# Patient Record
Sex: Female | Born: 1955 | Race: White | Hispanic: No | Marital: Married | Smoking: Former smoker
Health system: Southern US, Community
[De-identification: ages and names within clinical notes are randomized; demographics above are authoritative.]

## PROBLEM LIST (undated history)

## (undated) DIAGNOSIS — Z1509 Genetic susceptibility to other malignant neoplasm: Secondary | ICD-10-CM

## (undated) DIAGNOSIS — R000 Tachycardia, unspecified: Secondary | ICD-10-CM

## (undated) DIAGNOSIS — T8859XA Other complications of anesthesia, initial encounter: Secondary | ICD-10-CM

## (undated) DIAGNOSIS — H9190 Unspecified hearing loss, unspecified ear: Secondary | ICD-10-CM

## (undated) DIAGNOSIS — Z1589 Genetic susceptibility to other disease: Secondary | ICD-10-CM

## (undated) DIAGNOSIS — Z1501 Genetic susceptibility to malignant neoplasm of breast: Secondary | ICD-10-CM

## (undated) DIAGNOSIS — K589 Irritable bowel syndrome without diarrhea: Secondary | ICD-10-CM

## (undated) DIAGNOSIS — I499 Cardiac arrhythmia, unspecified: Secondary | ICD-10-CM

## (undated) DIAGNOSIS — M1990 Unspecified osteoarthritis, unspecified site: Secondary | ICD-10-CM

## (undated) DIAGNOSIS — T4145XA Adverse effect of unspecified anesthetic, initial encounter: Secondary | ICD-10-CM

## (undated) DIAGNOSIS — Z1502 Genetic susceptibility to malignant neoplasm of ovary: Secondary | ICD-10-CM

## (undated) DIAGNOSIS — J302 Other seasonal allergic rhinitis: Secondary | ICD-10-CM

## (undated) DIAGNOSIS — Z1379 Encounter for other screening for genetic and chromosomal anomalies: Principal | ICD-10-CM

## (undated) HISTORY — DX: Genetic susceptibility to other malignant neoplasm: Z15.09

## (undated) HISTORY — DX: Genetic susceptibility to other disease: Z15.89

## (undated) HISTORY — DX: Genetic susceptibility to malignant neoplasm of breast: Z15.01

## (undated) HISTORY — PX: TONSILLECTOMY: SUR1361

## (undated) HISTORY — DX: Encounter for other screening for genetic and chromosomal anomalies: Z13.79

## (undated) HISTORY — DX: Genetic susceptibility to malignant neoplasm of ovary: Z15.02

## (undated) HISTORY — PX: BACK SURGERY: SHX140

## (undated) HISTORY — DX: Tachycardia, unspecified: R00.0

---

## 1998-01-07 HISTORY — PX: CARPAL TUNNEL RELEASE: SHX101

## 1999-11-21 ENCOUNTER — Ambulatory Visit (HOSPITAL_COMMUNITY): Admission: RE | Admit: 1999-11-21 | Discharge: 1999-11-21 | Payer: Self-pay | Admitting: Family Medicine

## 1999-11-21 ENCOUNTER — Encounter: Payer: Self-pay | Admitting: Family Medicine

## 2000-09-29 ENCOUNTER — Encounter: Payer: Self-pay | Admitting: Family Medicine

## 2000-09-29 ENCOUNTER — Ambulatory Visit (HOSPITAL_COMMUNITY): Admission: RE | Admit: 2000-09-29 | Discharge: 2000-09-29 | Payer: Self-pay | Admitting: Family Medicine

## 2002-06-10 ENCOUNTER — Encounter: Admission: RE | Admit: 2002-06-10 | Discharge: 2002-06-10 | Payer: Self-pay | Admitting: Family Medicine

## 2002-06-10 ENCOUNTER — Encounter: Payer: Self-pay | Admitting: Family Medicine

## 2002-09-23 ENCOUNTER — Ambulatory Visit (HOSPITAL_COMMUNITY): Admission: RE | Admit: 2002-09-23 | Discharge: 2002-09-23 | Payer: Self-pay | Admitting: Family Medicine

## 2002-09-23 ENCOUNTER — Encounter: Payer: Self-pay | Admitting: Family Medicine

## 2003-08-26 ENCOUNTER — Encounter: Admission: RE | Admit: 2003-08-26 | Discharge: 2003-08-26 | Payer: Self-pay | Admitting: Family Medicine

## 2003-10-10 ENCOUNTER — Ambulatory Visit (HOSPITAL_COMMUNITY): Admission: RE | Admit: 2003-10-10 | Discharge: 2003-10-10 | Payer: Self-pay | Admitting: Family Medicine

## 2004-03-16 ENCOUNTER — Ambulatory Visit (HOSPITAL_COMMUNITY): Admission: RE | Admit: 2004-03-16 | Discharge: 2004-03-16 | Payer: Self-pay | Admitting: Gastroenterology

## 2004-10-08 ENCOUNTER — Other Ambulatory Visit: Admission: RE | Admit: 2004-10-08 | Discharge: 2004-10-08 | Payer: Self-pay | Admitting: Family Medicine

## 2004-11-19 ENCOUNTER — Other Ambulatory Visit: Admission: RE | Admit: 2004-11-19 | Discharge: 2004-11-19 | Payer: Self-pay | Admitting: Family Medicine

## 2005-04-17 ENCOUNTER — Encounter: Admission: RE | Admit: 2005-04-17 | Discharge: 2005-04-17 | Payer: Self-pay | Admitting: Family Medicine

## 2005-10-08 ENCOUNTER — Encounter: Admission: RE | Admit: 2005-10-08 | Discharge: 2005-10-08 | Payer: Self-pay | Admitting: *Deleted

## 2005-10-21 ENCOUNTER — Other Ambulatory Visit: Admission: RE | Admit: 2005-10-21 | Discharge: 2005-10-21 | Payer: Self-pay | Admitting: *Deleted

## 2006-05-27 ENCOUNTER — Encounter: Admission: RE | Admit: 2006-05-27 | Discharge: 2006-05-27 | Payer: Self-pay | Admitting: *Deleted

## 2006-09-30 ENCOUNTER — Encounter: Admission: RE | Admit: 2006-09-30 | Discharge: 2006-09-30 | Payer: Self-pay | Admitting: Family Medicine

## 2006-11-26 ENCOUNTER — Other Ambulatory Visit: Admission: RE | Admit: 2006-11-26 | Discharge: 2006-11-26 | Payer: Self-pay | Admitting: Family Medicine

## 2007-01-08 HISTORY — PX: BACK SURGERY: SHX140

## 2007-05-20 ENCOUNTER — Encounter: Admission: RE | Admit: 2007-05-20 | Discharge: 2007-05-20 | Payer: Self-pay | Admitting: Interventional Cardiology

## 2007-08-25 ENCOUNTER — Encounter (INDEPENDENT_AMBULATORY_CARE_PROVIDER_SITE_OTHER): Payer: Self-pay | Admitting: Orthopedic Surgery

## 2007-08-25 ENCOUNTER — Ambulatory Visit (HOSPITAL_COMMUNITY): Admission: RE | Admit: 2007-08-25 | Discharge: 2007-08-26 | Payer: Self-pay | Admitting: Orthopedic Surgery

## 2007-09-16 ENCOUNTER — Encounter: Admission: RE | Admit: 2007-09-16 | Discharge: 2007-09-16 | Payer: Self-pay | Admitting: Family Medicine

## 2007-09-22 ENCOUNTER — Encounter: Admission: RE | Admit: 2007-09-22 | Discharge: 2007-09-22 | Payer: Self-pay | Admitting: Family Medicine

## 2007-12-15 ENCOUNTER — Other Ambulatory Visit: Admission: RE | Admit: 2007-12-15 | Discharge: 2007-12-15 | Payer: Self-pay | Admitting: Family Medicine

## 2008-08-19 ENCOUNTER — Encounter: Admission: RE | Admit: 2008-08-19 | Discharge: 2008-08-19 | Payer: Self-pay | Admitting: Family Medicine

## 2008-12-19 ENCOUNTER — Other Ambulatory Visit: Admission: RE | Admit: 2008-12-19 | Discharge: 2008-12-19 | Payer: Self-pay | Admitting: Family Medicine

## 2009-09-18 ENCOUNTER — Encounter: Admission: RE | Admit: 2009-09-18 | Discharge: 2009-09-18 | Payer: Self-pay | Admitting: Family Medicine

## 2009-12-21 ENCOUNTER — Other Ambulatory Visit
Admission: RE | Admit: 2009-12-21 | Discharge: 2009-12-21 | Payer: Self-pay | Source: Home / Self Care | Admitting: Family Medicine

## 2010-01-29 ENCOUNTER — Encounter: Payer: Self-pay | Admitting: Family Medicine

## 2010-02-13 ENCOUNTER — Other Ambulatory Visit: Payer: Self-pay | Admitting: Obstetrics and Gynecology

## 2010-03-02 ENCOUNTER — Emergency Department (HOSPITAL_BASED_OUTPATIENT_CLINIC_OR_DEPARTMENT_OTHER)
Admission: EM | Admit: 2010-03-02 | Discharge: 2010-03-02 | Disposition: A | Discharge status: Home / Self Care | Payer: BC Managed Care – PPO | Attending: Emergency Medicine | Admitting: Emergency Medicine

## 2010-03-02 ENCOUNTER — Emergency Department (INDEPENDENT_AMBULATORY_CARE_PROVIDER_SITE_OTHER): Payer: BC Managed Care – PPO

## 2010-03-02 DIAGNOSIS — R079 Chest pain, unspecified: Secondary | ICD-10-CM

## 2010-03-02 DIAGNOSIS — Z79899 Other long term (current) drug therapy: Secondary | ICD-10-CM | POA: Insufficient documentation

## 2010-03-02 LAB — DIFFERENTIAL
Basophils Absolute: 0 10*3/uL (ref 0.0–0.1)
Basophils Relative: 0 % (ref 0–1)
Eosinophils Absolute: 0 10*3/uL (ref 0.0–0.7)
Eosinophils Relative: 1 % (ref 0–5)
Lymphocytes Relative: 21 % (ref 12–46)
Lymphs Abs: 1.8 10*3/uL (ref 0.7–4.0)
Monocytes Relative: 8 % (ref 3–12)
Neutro Abs: 6 10*3/uL (ref 1.7–7.7)
Neutrophils Relative %: 70 % (ref 43–77)

## 2010-03-02 LAB — BASIC METABOLIC PANEL
CO2: 26 mEq/L (ref 19–32)
Chloride: 107 mEq/L (ref 96–112)
Creatinine, Ser: 0.6 mg/dL (ref 0.4–1.2)

## 2010-03-02 LAB — URINALYSIS, ROUTINE W REFLEX MICROSCOPIC
Nitrite: NEGATIVE
Specific Gravity, Urine: 1.005 (ref 1.005–1.030)
Urine Glucose, Fasting: NEGATIVE mg/dL
Urobilinogen, UA: 0.2 mg/dL (ref 0.0–1.0)

## 2010-03-02 LAB — POCT CARDIAC MARKERS
CKMB, poc: <1 ng/mL — ABNORMAL LOW (ref 1.0–8.0)
CKMB, poc: <1 ng/mL — ABNORMAL LOW (ref 1.0–8.0)
Myoglobin, poc: 51.3 ng/mL (ref 12–200)
Troponin i, poc: <0.05 ng/mL (ref 0.00–0.09)

## 2010-03-02 LAB — CBC
Hemoglobin: 14.5 g/dL (ref 12.0–15.0)
MCHC: 34.9 g/dL (ref 30.0–36.0)
Platelets: 231 10*3/uL (ref 150–400)
WBC: 8.5 10*3/uL (ref 4.0–10.5)

## 2010-03-14 ENCOUNTER — Other Ambulatory Visit: Payer: Self-pay | Admitting: Family Medicine

## 2010-03-14 DIAGNOSIS — R52 Pain, unspecified: Secondary | ICD-10-CM

## 2010-03-14 DIAGNOSIS — R609 Edema, unspecified: Secondary | ICD-10-CM

## 2010-03-15 ENCOUNTER — Ambulatory Visit
Admission: RE | Admit: 2010-03-15 | Discharge: 2010-03-15 | Disposition: A | Discharge status: Home / Self Care | Payer: BC Managed Care – PPO | Source: Ambulatory Visit | Attending: Family Medicine | Admitting: Family Medicine

## 2010-03-15 DIAGNOSIS — R609 Edema, unspecified: Secondary | ICD-10-CM

## 2010-03-15 DIAGNOSIS — R52 Pain, unspecified: Secondary | ICD-10-CM

## 2010-04-19 ENCOUNTER — Other Ambulatory Visit: Payer: Self-pay | Admitting: Obstetrics and Gynecology

## 2010-05-10 ENCOUNTER — Other Ambulatory Visit: Payer: Self-pay | Admitting: Obstetrics and Gynecology

## 2010-05-22 NOTE — Op Note (Signed)
NAMENatarsha, [name]                 ACCOUNT NO.:  [id number]   MEDICAL RECORD NO.:  [id number]          PATIENT TYPE:  AMB   LOCATION:  DAY                          FACILITY:  [facility]   PHYSICIAN:  Marlowe Kays, M.D.  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   OPERATION:  Microdiskectomy and removal of free fragments over the body  of L5-S1 right.   SURGEON:  Marlowe Kays, M.D.   ASSISTANT:  Georges Lynch. Darrelyn Hillock, M.D.   ANESTHESIA:  General anesthesia.   PATHOLOGY AND JUSTIFICATION OF PROCEDURE:  She was having severe right  leg pain with MRI demonstrating not only a large disk herniation but  also disk material cephalad over the body of L5.  She also had a  neurologic deficit with diminished sensation over the lateral and  anterior calf and leg.   DESCRIPTION OF PROCEDURE:  Prophylactic antibiotics, satisfied general  anesthesia, knee-chest position on the Andrews frame, back was prepped  with DuraPrep, draped in sterile field, time-out performed.  With two  spinal needles and lateral x-ray, we tentatively localized the L5-S1  interspace and based on this I made my initial opening incision,  exposing the spinous process of what we thought was L5.  I tagged this  with a Kocher clamp placed a Penfield IV instrument just caudad to it  and took a second lateral x-ray confirming that we were indeed on the  spinous process of L5 and this also helped [place] localize the L5-S1 disk  precisely.  I then continued dissecting soft tissue off the sacrum and  the lamina of L5, placed a self-retaining McCullough retractor, first  with the double-action rongeur, I removed some of the inferior portion  of L5 and then undermined the  superior sacrum with a small curette and  using a 2 mm Kerrison rongeur and subsequently 3 mm Kerrison rongeurs,  began removing bone and ligamentum flavum performing a wide foraminotomy  and working laterally removing bone ligamentum flavum and then working  proximally removing a good bit of the inferior of the lamina of L5  because of the location of the free fragment.  We then brought in the  microscope and completed the decompression.  The S1 nerve root was  identified.  It was somewhat swollen and quite inflamed.  We removed  this medially and found the large disk herniation lateral to it.  I  opened this with a 15 knife blade and a large amount of disk material  came out under pressure.  We removed all disk material obtainable with a  combination of nerve hook, Epstein curette and straight and angled up-  biting pituitaries.  I then went cephalad in search for the fragments.  Just cephalad to the interspace was firm movable tissue over the body of  L5 which, although it did not look specifically like  disk material, was  movable and we removed this with a combination of 2 mm Kerrison rongeurs  and pituitaries.  We then went further cephalad and found a large free  fragment of disk material lying up underneath the dura which we removed  with a combination of nerve hook and pituitary.  This seemed to  completely decompress this level of the spine as well.  I was able to  place a hockey-stick down the foramen of the L5 nerve root which was  widely patent.  The dura and S1 nerve root were freely movable and the  foramen was also widely decompressed.  As we went along, potential  bleeders were coagulated with bipolar cautery.  I placed Gelfoam soaked  in thrombin, after irrigating the wound well, over the interspace around  the dura and S1 nerve root.  Self-retaining retractors were removed,  there was no unusual bleeding.  I closed the fascia with interrupted #1  Vicryl as well as the  deep subcutaneous tissue.  Superficial  subcutaneous tissue was closed 2-0 Vicryl and skin with staples.  Betadine, Adaptic and dry sterile dressing were applied.  She was gently  placed on her PACU bed and at the time of this dictation, was on her way  to the recovery room in satisfactory condition with no known  complications.           ______________________________  Marlowe Kays, M.D.     JA/MEDQ  D:  08/25/2007  T:  08/25/2007  Job:  [id number]

## 2010-05-25 NOTE — Op Note (Signed)
NAME:  [name], [name]                 ACCOUNT NO.:  [id number]   MEDICAL RECORD NO.:  [id number]          PATIENT TYPE:  AMB   LOCATION:  ENDO                         FACILITY:  [facility]   PHYSICIAN:  Graylin Shiver, M.D.   DATE OF BIRTH:  [birth year]   DATE OF PROCEDURE:  03/16/2004  DATE OF DISCHARGE:                                 OPERATIVE REPORT   PROCEDURE:  Colonoscopy.   INDICATIONS:  Intermittent rectal bleeding.   Informed consent was obtained after explanation of the risks of bleeding,  infection and perforation.   PREMEDICATION:  Fentanyl 100 mcg IV, Versed 10 mg IV.   PROCEDURE:  With the patient in the left lateral decubitus position, a  rectal exam was performed. No masses were felt. The Olympus colonoscope was  inserted into the rectum and advanced around the colon to the cecum. Cecal  landmarks were identified. The cecum and ascending colon were normal. The  transverse colon normal. The descending colon, sigmoid and rectum were  normal. She tolerated the procedure well without complications.   IMPRESSION:  Normal colonoscopy to the cecum.   I would recommend a follow-up screening colonoscopy again in 10 years.      SFG/MEDQ  D:  03/16/2004  T:  03/16/2004  Job:  [id number]   cc:   Elana Alm. Nicholos Johns, M.D.  [address]  [place]  [place] [address]  Fax: [phone]

## 2010-09-13 ENCOUNTER — Other Ambulatory Visit: Payer: Self-pay | Admitting: Family Medicine

## 2010-09-13 DIAGNOSIS — Z1231 Encounter for screening mammogram for malignant neoplasm of breast: Secondary | ICD-10-CM

## 2010-09-21 ENCOUNTER — Ambulatory Visit
Admission: RE | Admit: 2010-09-21 | Discharge: 2010-09-21 | Disposition: A | Discharge status: Home / Self Care | Payer: BC Managed Care – PPO | Source: Ambulatory Visit | Attending: Family Medicine | Admitting: Family Medicine

## 2010-09-21 DIAGNOSIS — Z1231 Encounter for screening mammogram for malignant neoplasm of breast: Secondary | ICD-10-CM

## 2011-04-23 ENCOUNTER — Encounter (INDEPENDENT_AMBULATORY_CARE_PROVIDER_SITE_OTHER): Payer: Self-pay | Admitting: Surgery

## 2011-05-14 ENCOUNTER — Encounter (INDEPENDENT_AMBULATORY_CARE_PROVIDER_SITE_OTHER): Payer: Self-pay | Admitting: Surgery

## 2011-05-14 ENCOUNTER — Ambulatory Visit (INDEPENDENT_AMBULATORY_CARE_PROVIDER_SITE_OTHER): Payer: BC Managed Care – PPO | Admitting: Surgery

## 2011-05-14 VITALS — BP 122/64 | HR 62 | Temp 98.6°F | Resp 16 | Ht 62.0 in | Wt 171.0 lb

## 2011-05-14 DIAGNOSIS — D1720 Benign lipomatous neoplasm of skin and subcutaneous tissue of unspecified limb: Secondary | ICD-10-CM

## 2011-05-14 DIAGNOSIS — D1779 Benign lipomatous neoplasm of other sites: Secondary | ICD-10-CM

## 2011-05-14 NOTE — Progress Notes (Signed)
Patient ID: [name], female   DOB: 04/21/1955, 55 y.o.   MRN: [id number]  Chief Complaint  Patient presents with  . Lipoma    new pt- eval lipoma on Left arm    HPI [name] is a 55 y.o. female.  Referred by Dr. Candice Smith for evaluation of an enlarging mass in her left arm HPI This is a 55 yo female who has palpated a mass in her upper left arm for about a year.  She has gained about 20 lbs in weight over the last year and has noted that this mass has also enlarged noticeably.  It causes some discomfort when she is in bed.  She is concerned that it might be cancer Past Medical History  Diagnosis Date  . Tachycardia     PVTS  . Lipoma     Left arm    Past Surgical History  Procedure Date  . Back surgery 2009  . Carpal tunnel release 2000    bilateral    Family History  Problem Relation Age of Onset  . COPD Mother   . Heart disease Mother   . Heart disease Father   . Cancer Maternal Aunt     breast    Social History History  Substance Use Topics  . Smoking status: Former Smoker    Quit date: 01/08/1987  . Smokeless tobacco: Not on file  . Alcohol Use: No    No Known Allergies  Current Outpatient Prescriptions  Medication Sig Dispense Refill  . atenolol (TENORMIN) 25 MG tablet daily.        Review of Systems Review of Systems  Constitutional: Negative for fever, chills and unexpected weight change.  HENT: Negative for hearing loss, congestion, sore throat, trouble swallowing and voice change.   Eyes: Negative for visual disturbance.  Respiratory: Negative for cough and wheezing.   Cardiovascular: Negative for chest pain, palpitations and leg swelling.  Gastrointestinal: Negative for nausea, vomiting, abdominal pain, diarrhea, constipation, blood in stool, abdominal distention and anal bleeding.  Genitourinary: Negative for hematuria, vaginal bleeding and difficulty urinating.  Musculoskeletal: Negative for arthralgias.  Skin: Negative for rash and  wound.  Neurological: Negative for seizures, syncope and headaches.  Hematological: Negative for adenopathy. Does not bruise/bleed easily.  Psychiatric/Behavioral: Negative for confusion.  Enlarging subcutaneous mass left arm  Blood pressure 122/64, pulse 62, temperature 98.6 F (37 C), temperature source Temporal, resp. rate 16, height 5' 2" (1.575 m), weight 171 lb (77.565 kg).  Physical Exam Physical Exam WDWN in NAD Left upper arm - dorsal aspect - 6 cm subcutaneous mass; mobile; no overlying skin changes; no sign of infection or inflammation  Data Reviewed None  Assessment    Subcutaneous lipoma - left upper arm - 6 cm with enlargement and more discomfort    Plan    Excision under anesthesia - therapeutic and diagnostic.  The surgical procedure has been discussed with the patient.  Potential risks, benefits, alternative treatments, and expected outcomes have been explained.  All of the patient's questions at this time have been answered.  The likelihood of reaching the patient's treatment goal is good.  The patient understand the proposed surgical procedure and wishes to proceed.        Meesha Sek K. 05/14/2011, 9:57 PM    

## 2011-05-21 ENCOUNTER — Encounter (HOSPITAL_BASED_OUTPATIENT_CLINIC_OR_DEPARTMENT_OTHER): Payer: Self-pay | Admitting: *Deleted

## 2011-05-21 NOTE — Progress Notes (Signed)
To come in for bmet-called dr Katrinka Blazing for [facility]

## 2011-05-22 ENCOUNTER — Telehealth (INDEPENDENT_AMBULATORY_CARE_PROVIDER_SITE_OTHER): Payer: Self-pay | Admitting: General Surgery

## 2011-05-22 ENCOUNTER — Telehealth (INDEPENDENT_AMBULATORY_CARE_PROVIDER_SITE_OTHER): Payer: Self-pay | Admitting: Surgery

## 2011-05-22 NOTE — Telephone Encounter (Signed)
Patient called in stated that she has been sick. She drained her sinuses and the discharge was yellow and brown. She is concerned that she will not be able to have surgery scheduled for 05/24/11 with Dr. Corliss Skains. Advised that the information will have to be forwarded to him and his nurse. I advised that Dr. Corliss Skains has to make the determination to proceed with surgery or not. She stated she will contact her regular doctor to get antibiotics and hope that it helps.   Patient scheduled for lipoma excision.

## 2011-05-23 ENCOUNTER — Telehealth (INDEPENDENT_AMBULATORY_CARE_PROVIDER_SITE_OTHER): Payer: Self-pay | Admitting: General Surgery

## 2011-05-23 ENCOUNTER — Other Ambulatory Visit (HOSPITAL_BASED_OUTPATIENT_CLINIC_OR_DEPARTMENT_OTHER): Payer: BC Managed Care – PPO

## 2011-05-23 ENCOUNTER — Encounter (HOSPITAL_BASED_OUTPATIENT_CLINIC_OR_DEPARTMENT_OTHER)
Admission: RE | Admit: 2011-05-23 | Discharge: 2011-05-23 | Disposition: A | Discharge status: Home / Self Care | Payer: BC Managed Care – PPO | Source: Ambulatory Visit | Attending: Surgery | Admitting: Surgery

## 2011-05-23 LAB — BASIC METABOLIC PANEL
Calcium: 9.5 mg/dL (ref 8.4–10.5)
Chloride: 103 mEq/L (ref 96–112)
Creatinine, Ser: 0.99 mg/dL (ref 0.50–1.10)
GFR calc Af Amer: 72 mL/min — ABNORMAL LOW (ref >90)

## 2011-05-23 NOTE — Telephone Encounter (Signed)
Called pt and told her that I talked to Dr Corliss Skains that she will still have her surgery tomorrow

## 2011-05-23 NOTE — Telephone Encounter (Signed)
Called pt to let her know that she is ok for surgery

## 2011-05-24 ENCOUNTER — Ambulatory Visit (HOSPITAL_BASED_OUTPATIENT_CLINIC_OR_DEPARTMENT_OTHER): Payer: BC Managed Care – PPO | Admitting: Anesthesiology

## 2011-05-24 ENCOUNTER — Encounter (HOSPITAL_BASED_OUTPATIENT_CLINIC_OR_DEPARTMENT_OTHER): Payer: Self-pay | Admitting: Anesthesiology

## 2011-05-24 ENCOUNTER — Encounter (HOSPITAL_BASED_OUTPATIENT_CLINIC_OR_DEPARTMENT_OTHER): Payer: Self-pay | Admitting: *Deleted

## 2011-05-24 ENCOUNTER — Ambulatory Visit (HOSPITAL_BASED_OUTPATIENT_CLINIC_OR_DEPARTMENT_OTHER)
Admission: RE | Admit: 2011-05-24 | Discharge: 2011-05-24 | Disposition: A | Discharge status: Home / Self Care | Payer: BC Managed Care – PPO | Source: Ambulatory Visit | Attending: Surgery | Admitting: Surgery

## 2011-05-24 ENCOUNTER — Encounter (HOSPITAL_BASED_OUTPATIENT_CLINIC_OR_DEPARTMENT_OTHER)
Admission: RE | Disposition: A | Discharge status: Home / Self Care | Payer: Self-pay | Source: Ambulatory Visit | Attending: Surgery

## 2011-05-24 DIAGNOSIS — I471 Supraventricular tachycardia, unspecified: Secondary | ICD-10-CM | POA: Insufficient documentation

## 2011-05-24 DIAGNOSIS — K08409 Partial loss of teeth, unspecified cause, unspecified class: Secondary | ICD-10-CM | POA: Insufficient documentation

## 2011-05-24 DIAGNOSIS — D1720 Benign lipomatous neoplasm of skin and subcutaneous tissue of unspecified limb: Secondary | ICD-10-CM

## 2011-05-24 DIAGNOSIS — D1739 Benign lipomatous neoplasm of skin and subcutaneous tissue of other sites: Secondary | ICD-10-CM

## 2011-05-24 DIAGNOSIS — Z87891 Personal history of nicotine dependence: Secondary | ICD-10-CM | POA: Insufficient documentation

## 2011-05-24 HISTORY — DX: Cardiac arrhythmia, unspecified: I49.9

## 2011-05-24 HISTORY — DX: Other complications of anesthesia, initial encounter: T88.59XA

## 2011-05-24 HISTORY — DX: Irritable bowel syndrome, unspecified: K58.9

## 2011-05-24 HISTORY — DX: Adverse effect of unspecified anesthetic, initial encounter: T41.45XA

## 2011-05-24 HISTORY — DX: Unspecified hearing loss, unspecified ear: H91.90

## 2011-05-24 HISTORY — PX: LIPOMA EXCISION: SHX5283

## 2011-05-24 HISTORY — DX: Other seasonal allergic rhinitis: J30.2

## 2011-05-24 HISTORY — DX: Unspecified osteoarthritis, unspecified site: M19.90

## 2011-05-24 SURGERY — EXCISION LIPOMA
Anesthesia: General | Site: Arm Upper | Laterality: Left

## 2011-05-24 MED ORDER — HYDROCODONE-ACETAMINOPHEN 5-325 MG PO TABS: 1.0000 | ORAL_TABLET | ORAL | Status: DC | PRN

## 2011-05-24 MED ORDER — MIDAZOLAM HCL 2 MG/2ML IJ SOLN: 0.5000 mg | Freq: Once | INTRAMUSCULAR | Status: DC | PRN

## 2011-05-24 MED ORDER — DEXAMETHASONE SODIUM PHOSPHATE 4 MG/ML IJ SOLN
INTRAMUSCULAR | Status: DC | PRN
  Administered 2011-05-24: 10 mg via INTRAVENOUS

## 2011-05-24 MED ORDER — PROMETHAZINE HCL 25 MG/ML IJ SOLN: 6.2500 mg | INTRAMUSCULAR | Status: DC | PRN

## 2011-05-24 MED ORDER — MORPHINE SULFATE 2 MG/ML IJ SOLN: 2.0000 mg | INTRAMUSCULAR | Status: DC | PRN

## 2011-05-24 MED ORDER — FENTANYL CITRATE 0.05 MG/ML IJ SOLN
INTRAMUSCULAR | Status: DC | PRN
  Administered 2011-05-24: 50 ug via INTRAVENOUS

## 2011-05-24 MED ORDER — 0.9 % SODIUM CHLORIDE (POUR BTL) OPTIME
TOPICAL | Status: DC | PRN
  Administered 2011-05-24: 1000 mL

## 2011-05-24 MED ORDER — PROPOFOL 10 MG/ML IV EMUL
INTRAVENOUS | Status: DC | PRN
  Administered 2011-05-24: 200 mg via INTRAVENOUS

## 2011-05-24 MED ORDER — HYDROCODONE-ACETAMINOPHEN 5-325 MG PO TABS: 1.0000 | ORAL_TABLET | ORAL | Status: AC | PRN

## 2011-05-24 MED ORDER — CEFAZOLIN SODIUM-DEXTROSE 2-3 GM-% IV SOLR
2.0000 g | INTRAVENOUS | Status: AC
  Administered 2011-05-24: 2 g via INTRAVENOUS

## 2011-05-24 MED ORDER — MEPERIDINE HCL 25 MG/ML IJ SOLN: 6.2500 mg | INTRAMUSCULAR | Status: DC | PRN

## 2011-05-24 MED ORDER — ONDANSETRON HCL 4 MG/2ML IJ SOLN: 4.0000 mg | INTRAMUSCULAR | Status: DC | PRN

## 2011-05-24 MED ORDER — HYDROMORPHONE HCL PF 1 MG/ML IJ SOLN
0.2500 mg | INTRAMUSCULAR | Status: DC | PRN
  Administered 2011-05-24: 0.5 mg via INTRAVENOUS

## 2011-05-24 MED ORDER — ONDANSETRON HCL 4 MG/2ML IJ SOLN
INTRAMUSCULAR | Status: DC | PRN
  Administered 2011-05-24: 4 mg via INTRAVENOUS

## 2011-05-24 MED ORDER — BUPIVACAINE-EPINEPHRINE 0.25% -1:200000 IJ SOLN
INTRAMUSCULAR | Status: DC | PRN
  Administered 2011-05-24: 20 mL

## 2011-05-24 MED ORDER — LACTATED RINGERS IV SOLN
INTRAVENOUS | Status: DC
  Administered 2011-05-24 (×2): via INTRAVENOUS

## 2011-05-24 SURGICAL SUPPLY — 39 items
BENZOIN TINCTURE PRP APPL 2/3 (GAUZE/BANDAGES/DRESSINGS) ×2 IMPLANT
BLADE SURG 15 STRL LF DISP TIS (BLADE) ×1 IMPLANT
BLADE SURG 15 STRL SS (BLADE) ×1
BLADE SURG ROTATE 9660 (MISCELLANEOUS) IMPLANT
CANISTER SUCTION 1200CC (MISCELLANEOUS) IMPLANT
CHLORAPREP W/TINT 26ML (MISCELLANEOUS) ×2 IMPLANT
COVER MAYO STAND STRL (DRAPES) ×2 IMPLANT
COVER TABLE BACK 60X90 (DRAPES) ×2 IMPLANT
DECANTER SPIKE VIAL GLASS SM (MISCELLANEOUS) IMPLANT
DRAPE PED LAPAROTOMY (DRAPES) ×2 IMPLANT
DRAPE UTILITY XL STRL (DRAPES) ×2 IMPLANT
DRSG TEGADERM 4X4.75 (GAUZE/BANDAGES/DRESSINGS) ×2 IMPLANT
ELECT COATED BLADE 2.86 ST (ELECTRODE) ×2 IMPLANT
ELECT REM PT RETURN 9FT ADLT (ELECTROSURGICAL) ×2
ELECTRODE REM PT RTRN 9FT ADLT (ELECTROSURGICAL) ×1 IMPLANT
GAUZE SPONGE 4X4 12PLY STRL LF (GAUZE/BANDAGES/DRESSINGS) IMPLANT
GLOVE BIO SURGEON STRL SZ7 (GLOVE) ×2 IMPLANT
GLOVE BIOGEL PI IND STRL 7.5 (GLOVE) ×1 IMPLANT
GLOVE BIOGEL PI INDICATOR 7.5 (GLOVE) ×1
GLOVE ECLIPSE 6.5 STRL STRAW (GLOVE) ×2 IMPLANT
GOWN PREVENTION PLUS XLARGE (GOWN DISPOSABLE) ×4 IMPLANT
NEEDLE HYPO 25X1 1.5 SAFETY (NEEDLE) ×2 IMPLANT
NS IRRIG 1000ML POUR BTL (IV SOLUTION) ×2 IMPLANT
PACK BASIN DAY SURGERY FS (CUSTOM PROCEDURE TRAY) ×2 IMPLANT
PENCIL BUTTON HOLSTER BLD 10FT (ELECTRODE) ×2 IMPLANT
SPONGE GAUZE 2X2 8PLY STRL LF (GAUZE/BANDAGES/DRESSINGS) ×2 IMPLANT
STRIP CLOSURE SKIN 1/2X4 (GAUZE/BANDAGES/DRESSINGS) ×2 IMPLANT
SUT MON AB 4-0 PC3 18 (SUTURE) ×2 IMPLANT
SUT PROLENE 6 0 P 1 18 (SUTURE) IMPLANT
SUT SILK 2 0 FS (SUTURE) IMPLANT
SUT VIC AB 3-0 SH 27 (SUTURE) ×1
SUT VIC AB 3-0 SH 27X BRD (SUTURE) ×1 IMPLANT
SUT VICRYL 3-0 CR8 SH (SUTURE) IMPLANT
SYR CONTROL 10ML LL (SYRINGE) ×2 IMPLANT
TOWEL OR 17X24 6PK STRL BLUE (TOWEL DISPOSABLE) ×2 IMPLANT
TOWEL OR NON WOVEN STRL DISP B (DISPOSABLE) ×2 IMPLANT
TUBE CONNECTING 20X1/4 (TUBING) IMPLANT
WATER STERILE IRR 1000ML POUR (IV SOLUTION) IMPLANT
YANKAUER SUCT BULB TIP NO VENT (SUCTIONS) IMPLANT

## 2011-05-24 NOTE — Op Note (Signed)
Pre-op diagnosis - subcutaneous lipoma - left upper arm Post-op diagnosis - same Procedure - Excision of subcutaneous lipoma - left upper arm Surgeon: Toniesha Zellner K. Anesthesia - GEN - LMA Indications - 56 yo female with slowly enlarging mass in posterior upper arm.  Presents now for excision Description of procedure:The patient was brought to the operating room and placed in a supine position on the OR table.  After an adequate level of anesthesia was obtained, a bump was placed under her left shoulder and her arm was placed across her chest.  We prepped the area over the lipoma with chloraprep and draped in sterile fashion.  We infiltrated with .25% marcaine and made a longitudinal incision.  We bluntly dissected around the large, lobular lipoma and removed it entirely.  There were a couple of small lipomas nearby which were also excised.  We inspected carefully for hemostasis.  The wound was closed with 3-0 Vicryl and 4-0 Monocryl.  Steri-strips and clean dressings were applied.  The patient was extubated and brought to the recovery room in stable condition.  Wilmon Arms. Corliss Skains, MD, [facility]  05/24/2011 11:49 AM

## 2011-05-24 NOTE — Anesthesia Procedure Notes (Signed)
Procedure Name: LMA Insertion Date/Time: 05/24/2011 11:19 AM Performed by: Gar Gibbon Pre-anesthesia Checklist: Patient identified, Emergency Drugs available, Suction available and Patient being monitored Patient Re-evaluated:Patient Re-evaluated prior to inductionOxygen Delivery Method: Circle System Utilized Preoxygenation: Pre-oxygenation with 100% oxygen Intubation Type: IV induction Ventilation: Mask ventilation without difficulty LMA: LMA inserted LMA Size: 4.0 Number of attempts: 1 Airway Equipment and Method: bite block Placement Confirmation: positive ETCO2 Tube secured with: Tape Dental Injury: Teeth and Oropharynx as per pre-operative assessment

## 2011-05-24 NOTE — Transfer of Care (Signed)
Immediate Anesthesia Transfer of Care Note  Patient: [name]  Procedure(s) Performed: Procedure(s) (LRB): EXCISION LIPOMA (Left)  Patient Location: PACU  Anesthesia Type: General  Level of Consciousness: sedated  Airway & Oxygen Therapy: Patient Spontanous Breathing and Patient connected to face mask oxygen  Post-op Assessment: Report given to PACU RN and Post -op Vital signs reviewed and stable  Post vital signs: Reviewed and stable  Complications: No apparent anesthesia complications

## 2011-05-24 NOTE — Anesthesia Preprocedure Evaluation (Signed)
Anesthesia Evaluation  Patient identified by MRN, date of birth, ID band Patient awake    Reviewed: Allergy & Precautions, H&P , NPO status , Patient's Chart, lab work & pertinent test results, reviewed documented beta blocker date and time   History of Anesthesia Complications Negative for: history of anesthetic complications  Airway Mallampati: I TM Distance: >3 FB Neck ROM: Full    Dental  (+) Edentulous Upper, Partial Lower and Dental Advisory Given   Pulmonary former smoker breath sounds clear to auscultation  Pulmonary exam normal       Cardiovascular + dysrhythmias (h/o PSVT- controlled with atenolol) Supra Ventricular Tachycardia Rhythm:Regular Rate:Normal  '05 ECHO: normal LVF, EF 60%, valves OK   Neuro/Psych negative neurological ROS     GI/Hepatic negative GI ROS, Neg liver ROS,   Endo/Other  Morbid obesity  Renal/GU negative Renal ROS     Musculoskeletal   Abdominal (+) + obese,   Peds  Hematology negative hematology ROS (+)   Anesthesia Other Findings   Reproductive/Obstetrics                           Anesthesia Physical Anesthesia Plan  ASA: II  Anesthesia Plan: General   Post-op Pain Management:    Induction: Intravenous  Airway Management Planned: LMA  Additional Equipment:   Intra-op Plan:   Post-operative Plan:   Informed Consent: I have reviewed the patients History and Physical, chart, labs and discussed the procedure including the risks, benefits and alternatives for the proposed anesthesia with the patient or authorized representative who has indicated his/her understanding and acceptance.   Dental advisory given  Plan Discussed with: CRNA and Surgeon  Anesthesia Plan Comments: (Plan routine monitors, GA- LMA OK)        Anesthesia Quick Evaluation

## 2011-05-24 NOTE — H&P (View-Only) (Signed)
Patient ID: [name], female   DOB: 1955/11/13, 56 y.o.   MRN: [id number]  Chief Complaint  Patient presents with  . Lipoma    new pt- eval lipoma on Left arm    HPI [name] is a 56 y.o. female.  Referred by Dr. Severiano Gilbert for evaluation of an enlarging mass in her left arm HPI This is a 56 yo female who has palpated a mass in her upper left arm for about a year.  She has gained about 20 lbs in weight over the last year and has noted that this mass has also enlarged noticeably.  It causes some discomfort when she is in bed.  She is concerned that it might be cancer Past Medical History  Diagnosis Date  . Tachycardia     PVTS  . Lipoma     Left arm    Past Surgical History  Procedure Date  . Back surgery 2009  . Carpal tunnel release 2000    bilateral    Family History  Problem Relation Age of Onset  . COPD Mother   . Heart disease Mother   . Heart disease Father   . Cancer Maternal Aunt     breast    Social History History  Substance Use Topics  . Smoking status: Former Smoker    Quit date: 01/08/1987  . Smokeless tobacco: Not on file  . Alcohol Use: No    No Known Allergies  Current Outpatient Prescriptions  Medication Sig Dispense Refill  . atenolol (TENORMIN) 25 MG tablet daily.        Review of Systems Review of Systems  Constitutional: Negative for fever, chills and unexpected weight change.  HENT: Negative for hearing loss, congestion, sore throat, trouble swallowing and voice change.   Eyes: Negative for visual disturbance.  Respiratory: Negative for cough and wheezing.   Cardiovascular: Negative for chest pain, palpitations and leg swelling.  Gastrointestinal: Negative for nausea, vomiting, abdominal pain, diarrhea, constipation, blood in stool, abdominal distention and anal bleeding.  Genitourinary: Negative for hematuria, vaginal bleeding and difficulty urinating.  Musculoskeletal: Negative for arthralgias.  Skin: Negative for rash and  wound.  Neurological: Negative for seizures, syncope and headaches.  Hematological: Negative for adenopathy. Does not bruise/bleed easily.  Psychiatric/Behavioral: Negative for confusion.  Enlarging subcutaneous mass left arm  Blood pressure 122/64, pulse 62, temperature 98.6 F (37 C), temperature source Temporal, resp. rate 16, height 5\' 2"  (1.575 m), weight 171 lb (77.565 kg).  Physical Exam Physical Exam WDWN in NAD Left upper arm - dorsal aspect - 6 cm subcutaneous mass; mobile; no overlying skin changes; no sign of infection or inflammation  Data Reviewed None  Assessment    Subcutaneous lipoma - left upper arm - 6 cm with enlargement and more discomfort    Plan    Excision under anesthesia - therapeutic and diagnostic.  The surgical procedure has been discussed with the patient.  Potential risks, benefits, alternative treatments, and expected outcomes have been explained.  All of the patient's questions at this time have been answered.  The likelihood of reaching the patient's treatment goal is good.  The patient understand the proposed surgical procedure and wishes to proceed.        Manish Ruggiero K. 05/14/2011, 9:57 PM

## 2011-05-24 NOTE — Interval H&P Note (Signed)
History and Physical Interval Note:  05/24/2011 10:52 AM  [name]  has presented today for surgery, with the diagnosis of 6cm subcutaneous lipoma-posterior left arm  The various methods of treatment have been discussed with the patient and family. After consideration of risks, benefits and other options for treatment, the patient has consented to  Procedure(s) (LRB): EXCISION LIPOMA (Left) as a surgical intervention .  The patients' history has been reviewed, patient examined, no change in status, stable for surgery.  I have reviewed the patients' chart and labs.  Questions were answered to the patient's satisfaction.     Julie Farkas K.

## 2011-05-24 NOTE — Discharge Instructions (Signed)
  Post Anesthesia Home Care Instructions  Activity: Get plenty of rest for the remainder of the day. A responsible adult should stay with you for 24 hours following the procedure.  For the next 24 hours, DO NOT: -Drive a car -Advertising copywriter -Drink alcoholic beverages -Take any medication unless instructed by your physician -Make any legal decisions or sign important papers.  Meals: Start with liquid foods such as gelatin or soup. Progress to regular foods as tolerated. Avoid greasy, spicy, heavy foods. If nausea and/or vomiting occur, drink only clear liquids until the nausea and/or vomiting subsides. Call your physician if vomiting continues.  Special Instructions/Symptoms: Your throat may feel dry or sore from the anesthesia or the breathing tube placed in your throat during surgery. If this causes discomfort, gargle with warm salt water. The discomfort should disappear within 24 hours.     Remove dressing and shower over the steri-strips beginning on Sunday.  The steri-strips will fall off in 1-2 weeks.

## 2011-05-24 NOTE — Anesthesia Postprocedure Evaluation (Signed)
  Anesthesia Post-op Note  Patient: [name]  Procedure(s) Performed: Procedure(s) (LRB): EXCISION LIPOMA (Left)  Patient Location: PACU  Anesthesia Type: General  Level of Consciousness: awake, alert  and oriented  Airway and Oxygen Therapy: Patient Spontanous Breathing  Post-op Pain: none  Post-op Assessment: Post-op Vital signs reviewed, Patient's Cardiovascular Status Stable, Respiratory Function Stable, Patent Airway, No signs of Nausea or vomiting and Pain level controlled  Post-op Vital Signs: Reviewed and stable  Complications: No apparent anesthesia complications

## 2011-05-27 ENCOUNTER — Encounter (HOSPITAL_BASED_OUTPATIENT_CLINIC_OR_DEPARTMENT_OTHER): Payer: Self-pay | Admitting: Surgery

## 2011-06-05 ENCOUNTER — Encounter (INDEPENDENT_AMBULATORY_CARE_PROVIDER_SITE_OTHER): Payer: Self-pay | Admitting: Surgery

## 2011-06-05 ENCOUNTER — Ambulatory Visit (INDEPENDENT_AMBULATORY_CARE_PROVIDER_SITE_OTHER): Payer: BC Managed Care – PPO | Admitting: Surgery

## 2011-06-05 VITALS — BP 112/74 | HR 74 | Temp 98.3°F | Resp 14 | Ht 62.0 in | Wt 172.0 lb

## 2011-06-05 DIAGNOSIS — D1720 Benign lipomatous neoplasm of skin and subcutaneous tissue of unspecified limb: Secondary | ICD-10-CM

## 2011-06-05 DIAGNOSIS — D1779 Benign lipomatous neoplasm of other sites: Secondary | ICD-10-CM

## 2011-06-05 NOTE — Progress Notes (Signed)
Excision of left arm lipoma on 5/17.  This mass measured 7.4 cm across and was a benign lipoma.  The incision is well-healed with no sign of infection.  No seroma or hematoma.  She may resume full activity  Follow-up PRN.  [name]. Corliss Skains, MD, [facility]  06/05/2011 2:28 PM

## 2011-08-28 ENCOUNTER — Other Ambulatory Visit: Payer: Self-pay | Admitting: Family Medicine

## 2011-08-28 DIAGNOSIS — Z1231 Encounter for screening mammogram for malignant neoplasm of breast: Secondary | ICD-10-CM

## 2011-09-23 ENCOUNTER — Ambulatory Visit: Payer: BC Managed Care – PPO

## 2011-10-07 ENCOUNTER — Ambulatory Visit
Admission: RE | Admit: 2011-10-07 | Discharge: 2011-10-07 | Disposition: A | Discharge status: Home / Self Care | Payer: BC Managed Care – PPO | Source: Ambulatory Visit | Attending: Family Medicine | Admitting: Family Medicine

## 2011-10-07 DIAGNOSIS — Z1231 Encounter for screening mammogram for malignant neoplasm of breast: Secondary | ICD-10-CM

## 2012-01-03 ENCOUNTER — Emergency Department (HOSPITAL_COMMUNITY): Payer: BC Managed Care – PPO

## 2012-01-03 ENCOUNTER — Encounter (HOSPITAL_COMMUNITY): Payer: Self-pay | Admitting: Emergency Medicine

## 2012-01-03 DIAGNOSIS — R209 Unspecified disturbances of skin sensation: Secondary | ICD-10-CM | POA: Insufficient documentation

## 2012-01-03 DIAGNOSIS — X500XXA Overexertion from strenuous movement or load, initial encounter: Secondary | ICD-10-CM | POA: Insufficient documentation

## 2012-01-03 DIAGNOSIS — Z872 Personal history of diseases of the skin and subcutaneous tissue: Secondary | ICD-10-CM | POA: Insufficient documentation

## 2012-01-03 DIAGNOSIS — R000 Tachycardia, unspecified: Secondary | ICD-10-CM | POA: Insufficient documentation

## 2012-01-03 DIAGNOSIS — Z8669 Personal history of other diseases of the nervous system and sense organs: Secondary | ICD-10-CM | POA: Insufficient documentation

## 2012-01-03 DIAGNOSIS — Z8719 Personal history of other diseases of the digestive system: Secondary | ICD-10-CM | POA: Insufficient documentation

## 2012-01-03 DIAGNOSIS — I499 Cardiac arrhythmia, unspecified: Secondary | ICD-10-CM | POA: Insufficient documentation

## 2012-01-03 DIAGNOSIS — Z87891 Personal history of nicotine dependence: Secondary | ICD-10-CM | POA: Insufficient documentation

## 2012-01-03 DIAGNOSIS — IMO0002 Reserved for concepts with insufficient information to code with codable children: Secondary | ICD-10-CM | POA: Insufficient documentation

## 2012-01-03 DIAGNOSIS — Y9389 Activity, other specified: Secondary | ICD-10-CM | POA: Insufficient documentation

## 2012-01-03 DIAGNOSIS — Y929 Unspecified place or not applicable: Secondary | ICD-10-CM | POA: Insufficient documentation

## 2012-01-03 DIAGNOSIS — Z79899 Other long term (current) drug therapy: Secondary | ICD-10-CM | POA: Insufficient documentation

## 2012-01-03 DIAGNOSIS — Z8739 Personal history of other diseases of the musculoskeletal system and connective tissue: Secondary | ICD-10-CM | POA: Insufficient documentation

## 2012-01-03 NOTE — ED Notes (Signed)
PT. REPORTS LOW BACK PAIN ONSET THIS EVENING , PT. STATED SHE TRIED TO REACH TOILET SEAT AND FELT PAIN AT HER LOWER BACK , DENIES FALL, PAIN WORSE WITH MOVEMENT /CERTAIN POSITIONS.

## 2012-01-04 ENCOUNTER — Emergency Department (HOSPITAL_COMMUNITY)
Admission: EM | Admit: 2012-01-04 | Discharge: 2012-01-04 | Disposition: A | Discharge status: Home / Self Care | Payer: BC Managed Care – PPO | Attending: Emergency Medicine | Admitting: Emergency Medicine

## 2012-01-04 DIAGNOSIS — S39012A Strain of muscle, fascia and tendon of lower back, initial encounter: Secondary | ICD-10-CM

## 2012-01-04 MED ORDER — NAPROXEN 375 MG PO TABS: 375.0000 mg | ORAL_TABLET | Freq: Two times a day (BID) | ORAL | Status: DC

## 2012-01-04 MED ORDER — MORPHINE SULFATE 4 MG/ML IJ SOLN
4.0000 mg | Freq: Once | INTRAMUSCULAR | Status: AC
  Administered 2012-01-04: 4 mg via INTRAVENOUS
  Filled 2012-01-04: qty 1

## 2012-01-04 MED ORDER — HYDROCODONE-ACETAMINOPHEN 5-325 MG PO TABS: 2.0000 | ORAL_TABLET | ORAL | Status: DC | PRN

## 2012-01-04 MED ORDER — KETOROLAC TROMETHAMINE 30 MG/ML IJ SOLN
30.0000 mg | Freq: Once | INTRAMUSCULAR | Status: AC
  Administered 2012-01-04: 30 mg via INTRAVENOUS
  Filled 2012-01-04: qty 1

## 2012-01-04 MED ORDER — ONDANSETRON HCL 4 MG/2ML IJ SOLN
4.0000 mg | Freq: Once | INTRAMUSCULAR | Status: AC
  Administered 2012-01-04: 4 mg via INTRAVENOUS
  Filled 2012-01-04: qty 2

## 2012-01-04 NOTE — ED Notes (Signed)
Pt ambulate with minimal assistance. No signs of distress noted. Pt states she feels stiff in her legs but walking ,akes it better. Pt walked to bathroom with out any issues

## 2012-01-04 NOTE — ED Provider Notes (Signed)
History     CSN: [id number]  Arrival date & time 01/03/12  2041   First MD Initiated Contact with Patient 01/04/12 0014      Chief Complaint  Patient presents with  . Back Pain    (Consider location/radiation/quality/duration/timing/severity/associated sxs/prior treatment) Patient is a 56 y.o. female presenting with back pain. The history is provided by the patient.  Back Pain  This is a recurrent problem. The current episode started 3 to 5 hours ago. The problem occurs constantly. The problem has not changed since onset.The pain is associated with twisting. The pain is present in the lumbar spine. The quality of the pain is described as aching. The pain is at a severity of 5/10. The pain is moderate. The symptoms are aggravated by bending and certain positions. Associated symptoms include numbness. Pertinent negatives include no chest pain, no fever, no weight loss, no headaches, no abdominal pain, no abdominal swelling, no bowel incontinence, no perianal numbness, no bladder incontinence, no dysuria, no pelvic pain, no leg pain, no paresthesias, no paresis, no tingling and no weakness. She has tried nothing for the symptoms.    Past Medical History  Diagnosis Date  . Tachycardia     PVTS  . Lipoma     Left arm  . Seasonal allergies   . Dysrhythmia     hx psvt  . IBS (irritable bowel syndrome)   . Arthritis   . Complication of anesthesia     sensitive to meds  . HOH (hard of hearing)     Past Surgical History  Procedure Date  . Back surgery 2009  . Carpal tunnel release 2000    bilateral  . Tonsillectomy   . Lipoma excision 05/24/2011    Procedure: EXCISION LIPOMA;  Surgeon: Wilmon Arms. Corliss Skains, MD;  Location: [facility];  Service: General;  Laterality: Left;  excision of left arm lipoma  . Back surgery     Family History  Problem Relation Age of Onset  . COPD Mother   . Heart disease Mother   . Heart disease Father   . Cancer Maternal Aunt     breast     History  Substance Use Topics  . Smoking status: Former Smoker    Quit date: 01/08/1987  . Smokeless tobacco: Not on file  . Alcohol Use: No    OB History    Grav Para Term Preterm Abortions TAB SAB Ect Mult Living                  Review of Systems  Constitutional: Negative for fever and weight loss.  Cardiovascular: Negative for chest pain.  Gastrointestinal: Negative for abdominal pain and bowel incontinence.  Genitourinary: Negative for bladder incontinence, dysuria and pelvic pain.  Musculoskeletal: Positive for back pain.  Neurological: Positive for numbness. Negative for tingling, weakness, headaches and paresthesias.  All other systems reviewed and are negative.    Allergies  Review of patient's allergies indicates no known allergies.  Home Medications   Current Outpatient Rx  Name  Route  Sig  Dispense  Refill  . ATENOLOL 25 MG PO TABS      daily.         Marland Kitchen LORATADINE 10 MG PO TABS   Oral   Take 10 mg by mouth daily.           BP 133/74  Pulse 77  Temp 97.8 F (36.6 C) (Oral)  Resp 18  SpO2 99%  Physical Exam  Constitutional:  She is oriented to person, place, and time. She appears well-developed and well-nourished.  HENT:  Head: Normocephalic and atraumatic.  Eyes: Conjunctivae normal and EOM are normal. Pupils are equal, round, and reactive to light.  Neck: Normal range of motion.  Cardiovascular: Normal rate, regular rhythm and normal heart sounds.   Pulmonary/Chest: Effort normal and breath sounds normal.  Abdominal: Soft. Bowel sounds are normal.  Musculoskeletal: Normal range of motion.  Neurological: She is alert and oriented to person, place, and time.  Skin: Skin is warm and dry.  Psychiatric: She has a normal mood and affect. Her behavior is normal.    ED Course  Procedures (including critical care time)  Labs Reviewed - No data to display Dg Lumbar Spine Complete  01/03/2012  *RADIOLOGY REPORT*  Clinical Data: Lower  back pain and right foot tingling.  LUMBAR SPINE - COMPLETE 4+ VIEW  Comparison: Lumbar spine radiograph performed 08/25/2007  Findings: There is no evidence of fracture or subluxation. Vertebral bodies demonstrate normal height and alignment.  There is mild narrowing of the intervertebral disc space at L5-S1, slightly more prominent than in 2009.  Mild facet disease is noted at the lower lumbar spine.  The visualized bowel gas pattern is unremarkable in appearance; air and stool are noted within the colon.  The sacroiliac joints are within normal limits.  IMPRESSION: No evidence of fracture or subluxation along the lumbar spine. Mild disc space narrowing at L5-S1 is slightly more prominent than in 2009.   Original Report Authenticated By: Tonia Ghent, M.D.      No diagnosis found.    MDM  + lumbar sprain.  No sxs of epidural compression or vascular insufficiency.  Will analgesia,  Reassess.  Anticipate oupt fu with established orthopaedist.        Rosanne Ashing, MD 01/04/12 0040

## 2012-07-16 ENCOUNTER — Other Ambulatory Visit: Payer: Self-pay | Admitting: Nurse Practitioner

## 2012-07-16 ENCOUNTER — Other Ambulatory Visit (HOSPITAL_COMMUNITY)
Admission: RE | Admit: 2012-07-16 | Discharge: 2012-07-16 | Disposition: A | Discharge status: Home / Self Care | Payer: BC Managed Care – PPO | Source: Ambulatory Visit | Attending: Obstetrics and Gynecology | Admitting: Obstetrics and Gynecology

## 2012-07-16 DIAGNOSIS — Z01419 Encounter for gynecological examination (general) (routine) without abnormal findings: Secondary | ICD-10-CM | POA: Insufficient documentation

## 2012-07-16 DIAGNOSIS — Z1151 Encounter for screening for human papillomavirus (HPV): Secondary | ICD-10-CM | POA: Insufficient documentation

## 2012-10-05 ENCOUNTER — Other Ambulatory Visit: Payer: Self-pay | Admitting: Family Medicine

## 2012-10-05 DIAGNOSIS — N644 Mastodynia: Secondary | ICD-10-CM

## 2012-10-07 ENCOUNTER — Other Ambulatory Visit: Payer: Self-pay | Admitting: Family Medicine

## 2012-10-07 DIAGNOSIS — Z1231 Encounter for screening mammogram for malignant neoplasm of breast: Secondary | ICD-10-CM

## 2012-10-26 ENCOUNTER — Ambulatory Visit
Admission: RE | Admit: 2012-10-26 | Discharge: 2012-10-26 | Disposition: A | Discharge status: Home / Self Care | Payer: BC Managed Care – PPO | Source: Ambulatory Visit | Attending: Family Medicine | Admitting: Family Medicine

## 2012-10-26 DIAGNOSIS — Z1231 Encounter for screening mammogram for malignant neoplasm of breast: Secondary | ICD-10-CM

## 2013-06-23 ENCOUNTER — Telehealth: Payer: Self-pay | Admitting: Interventional Cardiology

## 2013-06-23 NOTE — Telephone Encounter (Signed)
Patient is doing great. She would prefer to follow up as needed. But she will need a yearly RF on her Atenolo. Is it ok for her to cancel appt on 06/30/13 since she is doing great.

## 2013-06-24 NOTE — Telephone Encounter (Signed)
pt aware that she will need to be seen yearly for Dr.Smith to continue to refill her medications.pt adv that she could have her pcp fill her medications.pt sts that she is doing well from a cardiac standpoint and does not think she needs to be seen.pt sts that she will contact her pcp to see if she would be willing to fill her atenolol. pt will keep upcoming appt with Dr.Smith for now

## 2013-06-24 NOTE — Telephone Encounter (Signed)
Follow up     Want to know if she can see Dr Tamala Julian in 2016 because she is not having any problems but still get her presc refilled

## 2013-06-30 ENCOUNTER — Encounter: Payer: Self-pay | Admitting: Interventional Cardiology

## 2013-06-30 ENCOUNTER — Ambulatory Visit (INDEPENDENT_AMBULATORY_CARE_PROVIDER_SITE_OTHER): Payer: BC Managed Care – PPO | Admitting: Interventional Cardiology

## 2013-06-30 VITALS — BP 126/68 | HR 63 | Ht 62.0 in | Wt 170.0 lb

## 2013-06-30 DIAGNOSIS — E785 Hyperlipidemia, unspecified: Secondary | ICD-10-CM

## 2013-06-30 DIAGNOSIS — G4733 Obstructive sleep apnea (adult) (pediatric): Secondary | ICD-10-CM

## 2013-06-30 DIAGNOSIS — I471 Supraventricular tachycardia: Secondary | ICD-10-CM

## 2013-06-30 MED ORDER — ATENOLOL 25 MG PO TABS: 12.5000 mg | ORAL_TABLET | Freq: Two times a day (BID) | ORAL | Status: DC

## 2013-06-30 NOTE — Patient Instructions (Signed)
Your physician recommends that you continue on your current medications as directed. Please refer to the Current Medication list given to you today.  Your physician wants you to follow-up in: 1 year. You will receive a reminder letter in the mail two months in advance. If you don't receive a letter, please call our office to schedule the follow-up appointment.  

## 2013-06-30 NOTE — Progress Notes (Signed)
Patient ID: [name], female   DOB: 1955/11/07, 58 y.o.   MRN: [id number]    [address], [address] [place], [place]  [address] Phone: [phone] Fax:  [phone]  Date:  06/30/2013   ID:  [name], DOB Jun 18, 1955, MRN [id number]  PCP:  Reginia Naas, MD   ASSESSMENT:  1. History of paroxysmal atrial tachycardia. Very rare episodes on current medical regimen. She has been active and feels that her lifestyle is as good as it can be. No medication side effects. 2. Hyperlipidemia being managed with diet 3. Obesity 4. Sleep apnea, managed  PLAN:  1. Continue a tunnel all 12.5 mg twice a day 2. Clinical followup in one year 3. Call if prolonged palpitations   SUBJECTIVE: [name] is a 58 y.o. female who has no significant complaints with reference to PSVT episodes. No medication side effects. No episodes of syncope.   Wt Readings from Last 3 Encounters:  06/30/13 170 lb (77.111 kg)  06/05/11 172 lb (78.019 kg)  05/14/11 171 lb (77.565 kg)     Past Medical History  Diagnosis Date  . Tachycardia     PVTS  . Lipoma     Left arm  . Seasonal allergies   . Dysrhythmia     hx psvt  . IBS (irritable bowel syndrome)   . Arthritis   . Complication of anesthesia     sensitive to meds  . HOH (hard of hearing)     Current Outpatient Prescriptions  Medication Sig Dispense Refill  . atenolol (TENORMIN) 25 MG tablet Take 12.5 mg by mouth 2 (two) times daily.       Marland Kitchen HYDROcodone-acetaminophen (NORCO/VICODIN) 5-325 MG per tablet Take 2 tablets by mouth every 4 (four) hours as needed for pain.  10 tablet  0  . ibuprofen (ADVIL,MOTRIN) 200 MG tablet Take 400 mg by mouth every 6 (six) hours as needed. headache      . loratadine (CLARITIN) 10 MG tablet Take 10 mg by mouth daily as needed. For allergies      . naproxen (NAPROSYN) 375 MG tablet Take 375 mg by mouth as needed.       No current facility-administered medications for this visit.    Allergies:   No  Known Allergies  Social History:  The patient  reports that she quit smoking about 26 years ago. She does not have any smokeless tobacco history on file. She reports that she does not drink alcohol or use illicit drugs.   ROS:  Please see the history of present illness.   Try to lose weight and staying active. No chest discomfort. Denies dyspnea.   All other systems reviewed and negative.   OBJECTIVE: VS:  BP 126/68  Pulse 63  Ht 5\' 2"  (1.575 m)  Wt 170 lb (77.111 kg)  BMI 31.09 kg/m2 Well nourished, well developed, in no acute distress, obese HEENT: normal Neck: JVd flat. Carotid bruit absent  Cardiac:  normal S1, S2; RRR; no murmur Lungs:  clear to auscultation bilaterally, no wheezing, rhonchi or rales Abd: soft, nontender, no hepatomegaly Ext: Edema absent. Pulses 2+ Skin: warm and dry Neuro:  CNs 2-12 intact, no focal abnormalities noted  EKG:  First-degree AV block otherwise normal       Signed, Illene Labrador III, MD 06/30/2013 10:10 AM

## 2013-09-14 ENCOUNTER — Other Ambulatory Visit: Payer: Self-pay

## 2013-09-14 DIAGNOSIS — Z1231 Encounter for screening mammogram for malignant neoplasm of breast: Secondary | ICD-10-CM

## 2013-10-12 ENCOUNTER — Other Ambulatory Visit: Payer: Self-pay | Admitting: Family Medicine

## 2013-10-12 DIAGNOSIS — N6310 Unspecified lump in the right breast, unspecified quadrant: Secondary | ICD-10-CM

## 2013-10-19 ENCOUNTER — Ambulatory Visit
Admission: RE | Admit: 2013-10-19 | Discharge: 2013-10-19 | Disposition: A | Discharge status: Home / Self Care | Payer: BC Managed Care – PPO | Source: Ambulatory Visit | Attending: Family Medicine | Admitting: Family Medicine

## 2013-10-19 ENCOUNTER — Encounter (INDEPENDENT_AMBULATORY_CARE_PROVIDER_SITE_OTHER): Payer: Self-pay

## 2013-10-19 ENCOUNTER — Other Ambulatory Visit: Payer: Self-pay | Admitting: Family Medicine

## 2013-10-19 DIAGNOSIS — N6310 Unspecified lump in the right breast, unspecified quadrant: Secondary | ICD-10-CM

## 2013-10-27 ENCOUNTER — Ambulatory Visit: Payer: BC Managed Care – PPO

## 2013-11-08 ENCOUNTER — Other Ambulatory Visit: Payer: Self-pay | Admitting: Family Medicine

## 2013-11-08 DIAGNOSIS — N6310 Unspecified lump in the right breast, unspecified quadrant: Secondary | ICD-10-CM

## 2013-11-10 ENCOUNTER — Other Ambulatory Visit: Payer: BC Managed Care – PPO

## 2013-11-16 ENCOUNTER — Other Ambulatory Visit: Payer: Medicare Other | Admitting: Family Medicine

## 2013-11-16 ENCOUNTER — Ambulatory Visit
Admission: RE | Admit: 2013-11-16 | Discharge: 2013-11-16 | Disposition: A | Discharge status: Home / Self Care | Payer: Medicare Other | Source: Ambulatory Visit | Attending: Family Medicine | Admitting: Family Medicine

## 2013-11-16 DIAGNOSIS — N6310 Unspecified lump in the right breast, unspecified quadrant: Secondary | ICD-10-CM

## 2014-01-11 ENCOUNTER — Other Ambulatory Visit: Payer: Self-pay | Admitting: Nurse Practitioner

## 2014-01-11 ENCOUNTER — Other Ambulatory Visit (HOSPITAL_COMMUNITY)
Admission: RE | Admit: 2014-01-11 | Discharge: 2014-01-11 | Disposition: A | Discharge status: Home / Self Care | Payer: PPO | Source: Ambulatory Visit | Attending: Obstetrics and Gynecology | Admitting: Obstetrics and Gynecology

## 2014-01-11 DIAGNOSIS — Z01419 Encounter for gynecological examination (general) (routine) without abnormal findings: Secondary | ICD-10-CM | POA: Diagnosis present

## 2014-01-21 LAB — CYTOLOGY - PAP

## 2014-04-21 ENCOUNTER — Other Ambulatory Visit: Payer: Self-pay

## 2014-04-21 DIAGNOSIS — I471 Supraventricular tachycardia: Secondary | ICD-10-CM

## 2014-04-21 MED ORDER — ATENOLOL 25 MG PO TABS: 12.5000 mg | ORAL_TABLET | Freq: Two times a day (BID) | ORAL | Status: DC

## 2014-07-05 ENCOUNTER — Telehealth: Payer: Self-pay | Admitting: Interventional Cardiology

## 2014-07-05 NOTE — Telephone Encounter (Signed)
Returned pt call. Adv her that our medical records dept has requested a copy of pt's recent EKG done at her pcp's office.pt verbalized understanding.

## 2014-07-05 NOTE — Telephone Encounter (Signed)
New Message       Pt calling stating that she wants to get her EKG faxed over from her PCP but her PCP office stated that we need to request it from them. [facility], Dr. Carol Ada Phone [phone]. Please call back and advise.

## 2014-07-06 NOTE — Progress Notes (Addendum)
Cardiology Office Note   Date:  07/07/2014   ID:  LARIZA COTHRON, [name] 1955-07-08, MRN [id number]  PCP:  Reginia Naas, MD  Cardiologist:  Sinclair Grooms, MD   Chief Complaint  Patient presents with  . Tachycardia      History of Present Illness: TERRALYN MATSUMURA is a 59 y.o. female who presents for PSVT.  Doing well. No chest discomfort of prolonged tachycardia.    Past Medical History  Diagnosis Date  . Tachycardia     PVTS  . Lipoma     Left arm  . Seasonal allergies   . Dysrhythmia     hx psvt  . IBS (irritable bowel syndrome)   . Arthritis   . Complication of anesthesia     sensitive to meds  . HOH (hard of hearing)     Past Surgical History  Procedure Laterality Date  . Back surgery  2009  . Carpal tunnel release  2000    bilateral  . Tonsillectomy    . Lipoma excision  05/24/2011    Procedure: EXCISION LIPOMA;  Surgeon: Imogene Burn. Georgette Dover, MD;  Location: [facility];  Service: General;  Laterality: Left;  excision of left arm lipoma  . Back surgery       Current Outpatient Prescriptions  Medication Sig Dispense Refill  . atenolol (TENORMIN) 25 MG tablet Take 0.5 tablets (12.5 mg total) by mouth 2 (two) times daily. 90 tablet 1  . HYDROcodone-acetaminophen (NORCO/VICODIN) 5-325 MG per tablet Take 2 tablets by mouth every 4 (four) hours as needed for pain. 10 tablet 0  . ibuprofen (ADVIL,MOTRIN) 200 MG tablet Take 400 mg by mouth every 6 (six) hours as needed. headache    . loratadine (CLARITIN) 10 MG tablet Take 10 mg by mouth daily as needed. For allergies    . naproxen (NAPROSYN) 375 MG tablet Take 375 mg by mouth as needed for mild pain.      No current facility-administered medications for this visit.    Allergies:   Review of patient's allergies indicates no known allergies.    Social History:  The patient  reports that she quit smoking about 27 years ago. She has never used smokeless tobacco. She reports that she does not  drink alcohol or use illicit drugs.   Family History:  The patient's family history includes COPD in her mother; Cancer in her maternal aunt; Heart disease in her father and mother.    ROS:  Please see the history of present illness.   Otherwise, review of systems are positive for none.   All other systems are reviewed and negative.    PHYSICAL EXAM: VS:  BP 110/72 mmHg  Pulse 61  Ht 5\' 2"  (1.575 m)  Wt 75.751 kg (167 lb)  BMI 30.54 kg/m2  SpO2 94% , BMI Body mass index is 30.54 kg/(m^2). GEN: Well nourished, well developed, in no acute distress HEENT: normal Neck: no JVD, carotid bruits, or masses Cardiac: RRR; no murmurs, rubs, or gallops,no edema  Respiratory:  clear to auscultation bilaterally, normal work of breathing GI: soft, nontender, nondistended, + BS MS: no deformity or atrophy Skin: warm and dry, no rash Neuro:  Strength and sensation are intact Psych: euthymic mood, full affect   EKG:  EKG is not ordered today. ECG performed of the primary care on 03/22/2014 is normal. Normal sinus with the exception of first ray AV block.   Recent Labs: No results found for requested labs within  last 365 days.    Lipid Panel No results found for: CHOL, TRIG, HDL, CHOLHDL, VLDL, LDLCALC, LDLDIRECT    Wt Readings from Last 3 Encounters:  07/07/14 75.751 kg (167 lb)  06/30/13 77.111 kg (170 lb)  06/05/11 78.019 kg (172 lb)      Other studies Reviewed: Additional studies/ records that were reviewed today include:.    ASSESSMENT AND PLAN:  1. PSVT (paroxysmal supraventricular tachycardia) Quiescent  2. Obstructive sleep apnea Using C Pap  3. Hyperlipidemia Diet and exercise    Current medicines are reviewed at length with the patient today.  The patient does not have concerns regarding medicines.  The following changes have been made:  no change  Labs/ tests ordered today include:  No orders of the defined types were placed in this encounter.      Disposition:   FU with HS in 1 year  Signed, Sinclair Grooms, MD  07/07/2014 8:31 AM    [facility] [address], [place], [place]  [address] Phone: [phone]; Fax: [phone]

## 2014-07-07 ENCOUNTER — Ambulatory Visit (INDEPENDENT_AMBULATORY_CARE_PROVIDER_SITE_OTHER): Payer: PPO | Admitting: Interventional Cardiology

## 2014-07-07 ENCOUNTER — Encounter: Payer: Self-pay | Admitting: Interventional Cardiology

## 2014-07-07 VITALS — BP 110/72 | HR 61 | Ht 62.0 in | Wt 167.0 lb

## 2014-07-07 DIAGNOSIS — E785 Hyperlipidemia, unspecified: Secondary | ICD-10-CM

## 2014-07-07 DIAGNOSIS — I471 Supraventricular tachycardia: Secondary | ICD-10-CM | POA: Diagnosis not present

## 2014-07-07 DIAGNOSIS — G4733 Obstructive sleep apnea (adult) (pediatric): Secondary | ICD-10-CM | POA: Diagnosis not present

## 2014-07-07 MED ORDER — ATENOLOL 25 MG PO TABS: 12.5000 mg | ORAL_TABLET | Freq: Two times a day (BID) | ORAL | Status: DC

## 2014-07-07 NOTE — Patient Instructions (Signed)

## 2014-10-25 ENCOUNTER — Other Ambulatory Visit: Payer: Self-pay

## 2014-10-25 DIAGNOSIS — Z1231 Encounter for screening mammogram for malignant neoplasm of breast: Secondary | ICD-10-CM

## 2014-11-11 ENCOUNTER — Ambulatory Visit: Payer: PPO

## 2014-11-17 ENCOUNTER — Ambulatory Visit
Admission: RE | Admit: 2014-11-17 | Discharge: 2014-11-17 | Disposition: A | Discharge status: Home / Self Care | Payer: PPO | Source: Ambulatory Visit

## 2014-11-17 ENCOUNTER — Other Ambulatory Visit: Payer: Self-pay | Admitting: Interventional Cardiology

## 2014-11-17 DIAGNOSIS — Z1231 Encounter for screening mammogram for malignant neoplasm of breast: Secondary | ICD-10-CM

## 2015-03-02 ENCOUNTER — Other Ambulatory Visit (HOSPITAL_COMMUNITY)
Admission: RE | Admit: 2015-03-02 | Discharge: 2015-03-02 | Disposition: A | Discharge status: Home / Self Care | Payer: PPO | Source: Ambulatory Visit | Attending: Nurse Practitioner | Admitting: Nurse Practitioner

## 2015-03-02 ENCOUNTER — Other Ambulatory Visit: Payer: Self-pay | Admitting: Nurse Practitioner

## 2015-03-02 DIAGNOSIS — Z01419 Encounter for gynecological examination (general) (routine) without abnormal findings: Secondary | ICD-10-CM | POA: Insufficient documentation

## 2015-03-02 DIAGNOSIS — Z1151 Encounter for screening for human papillomavirus (HPV): Secondary | ICD-10-CM | POA: Diagnosis not present

## 2015-03-02 DIAGNOSIS — N762 Acute vulvitis: Secondary | ICD-10-CM | POA: Diagnosis not present

## 2015-03-06 LAB — CYTOLOGY - PAP

## 2015-03-27 DIAGNOSIS — Z1389 Encounter for screening for other disorder: Secondary | ICD-10-CM | POA: Diagnosis not present

## 2015-03-27 DIAGNOSIS — Z0000 Encounter for general adult medical examination without abnormal findings: Secondary | ICD-10-CM | POA: Diagnosis not present

## 2015-03-27 DIAGNOSIS — E782 Mixed hyperlipidemia: Secondary | ICD-10-CM | POA: Diagnosis not present

## 2015-03-30 DIAGNOSIS — H2513 Age-related nuclear cataract, bilateral: Secondary | ICD-10-CM | POA: Diagnosis not present

## 2015-07-18 DIAGNOSIS — J069 Acute upper respiratory infection, unspecified: Secondary | ICD-10-CM | POA: Diagnosis not present

## 2015-07-25 ENCOUNTER — Other Ambulatory Visit: Payer: Self-pay | Admitting: Interventional Cardiology

## 2015-07-25 DIAGNOSIS — I471 Supraventricular tachycardia: Secondary | ICD-10-CM

## 2015-07-25 MED ORDER — ATENOLOL 25 MG PO TABS: 12.5000 mg | ORAL_TABLET | Freq: Two times a day (BID) | ORAL | Status: DC

## 2015-07-28 ENCOUNTER — Encounter: Payer: Self-pay | Admitting: Interventional Cardiology

## 2015-07-28 ENCOUNTER — Ambulatory Visit (INDEPENDENT_AMBULATORY_CARE_PROVIDER_SITE_OTHER): Payer: PPO | Admitting: Interventional Cardiology

## 2015-07-28 VITALS — BP 112/76 | HR 66 | Ht 62.0 in | Wt 164.6 lb

## 2015-07-28 DIAGNOSIS — G4733 Obstructive sleep apnea (adult) (pediatric): Secondary | ICD-10-CM

## 2015-07-28 DIAGNOSIS — I471 Supraventricular tachycardia: Secondary | ICD-10-CM

## 2015-07-28 DIAGNOSIS — E785 Hyperlipidemia, unspecified: Secondary | ICD-10-CM | POA: Diagnosis not present

## 2015-07-28 NOTE — Patient Instructions (Addendum)
Medication Instructions:  Your physician has recommended you make the following change in your medication:  STOP Atenolol   Labwork: None ordered  Testing/Procedures: None ordered  Follow-Up: Your physician recommends that you schedule a follow-up appointment as needed   Any Other Special Instructions Will Be Listed Below (If Applicable).     If you need a refill on your cardiac medications before your next appointment, please call your pharmacy.

## 2015-07-28 NOTE — Progress Notes (Signed)
Cardiology Office Note    Date:  07/28/2015   ID:  [name], DOB 10-Jan-1955, MRN [id number]  PCP:  Reginia Naas, MD  Cardiologist: Sinclair Grooms, MD   Chief Complaint  Patient presents with  . Palpitations    SVT    History of Present Illness:  [name] is a 60 y.o. female follow-up PSVT controlled on beta blocker therapy.  No significant complaints. No prolonged episodes of PSVT. She is compliant with low-dose medical therapy.  Past Medical History  Diagnosis Date  . Tachycardia     PVTS  . Lipoma     Left arm  . Seasonal allergies   . Dysrhythmia     hx psvt  . IBS (irritable bowel syndrome)   . Arthritis   . Complication of anesthesia     sensitive to meds  . HOH (hard of hearing)     Past Surgical History  Procedure Laterality Date  . Back surgery  2009  . Carpal tunnel release  2000    bilateral  . Tonsillectomy    . Lipoma excision  05/24/2011    Procedure: EXCISION LIPOMA;  Surgeon: Imogene Burn. Georgette Dover, MD;  Location: [facility];  Service: General;  Laterality: Left;  excision of left arm lipoma  . Back surgery      Current Medications: Outpatient Prescriptions Prior to Visit  Medication Sig Dispense Refill  . atenolol (TENORMIN) 25 MG tablet Take 0.5 tablets (12.5 mg total) by mouth 2 (two) times daily. 30 tablet 0  . HYDROcodone-acetaminophen (NORCO/VICODIN) 5-325 MG per tablet Take 2 tablets by mouth every 4 (four) hours as needed for pain. 10 tablet 0  . ibuprofen (ADVIL,MOTRIN) 200 MG tablet Take 400 mg by mouth every 6 (six) hours as needed. headache    . loratadine (CLARITIN) 10 MG tablet Take 10 mg by mouth daily as needed. For allergies    . naproxen (NAPROSYN) 375 MG tablet Take 375 mg by mouth as needed for mild pain.      No facility-administered medications prior to visit.     Allergies:   Review of patient's allergies indicates no known allergies.   Social History   Social History  . Marital  Status: Married    Spouse Name: N/A  . Number of Children: N/A  . Years of Education: N/A   Social History Main Topics  . Smoking status: Former Smoker    Quit date: 01/08/1987  . Smokeless tobacco: Never Used  . Alcohol Use: No  . Drug Use: No  . Sexual Activity: Not Asked   Other Topics Concern  . None   Social History Narrative     Family History:  The patient's family history includes COPD in her mother; Cancer in her maternal aunt; Heart disease in her father and mother.   ROS:   Please see the history of present illness.    None  All other systems reviewed and are negative.   PHYSICAL EXAM:   VS:  BP 112/76 mmHg  Pulse 66  Ht 5\' 2"  (1.575 m)  Wt 164 lb 9.6 oz (74.662 kg)  BMI 30.10 kg/m2   GEN: Well nourished, well developed, in no acute distress HEENT: normal Neck: no JVD, carotid bruits, or masses Cardiac: RRR; no murmurs, rubs, or gallops,no edema  Respiratory:  clear to auscultation bilaterally, normal work of breathing GI: soft, nontender, nondistended, + BS MS: no deformity or atrophy Skin: warm and dry, no rash Neuro:  Alert and Oriented x 3, Strength and sensation are intact Psych: euthymic mood, full affect  Wt Readings from Last 3 Encounters:  07/28/15 164 lb 9.6 oz (74.662 kg)  07/07/14 167 lb (75.751 kg)  06/30/13 170 lb (77.111 kg)      Studies/Labs Reviewed:   EKG:  EKG  Sinus rhythm, first grade AV block 224 ms, occasional PAC, nonspecific ST abnormality.  Recent Labs: No results found for requested labs within last 365 days.   Lipid Panel No results found for: CHOL, TRIG, HDL, CHOLHDL, VLDL, LDLCALC, LDLDIRECT  Additional studies/ records that were reviewed today include:  None    ASSESSMENT:    1. PSVT (paroxysmal supraventricular tachycardia) ([place])   2. Hyperlipidemia   3. Obstructive sleep apnea      PLAN:  In order of problems listed above:  1. Decrease atenolol to 12.5 mg daily for 2 weeks then discontinue if no  episodes of PSVT. Please call if recurrent PSVT off therapy. If PSVT T Felts, resume a atenolol at the previous dose. 2. One-year follow-up if recurrent ectopy/PSVT.    Medication Adjustments/Labs and Tests Ordered: Current medicines are reviewed at length with the patient today.  Concerns regarding medicines are outlined above.  Medication changes, Labs and Tests ordered today are listed in the Patient Instructions below. There are no Patient Instructions on file for this visit.   Signed, Sinclair Grooms, MD  07/28/2015 12:33 PM    [facility] [place], [place], [place]  [address] Phone: [phone]; Fax: [phone]

## 2015-08-14 ENCOUNTER — Other Ambulatory Visit: Payer: Self-pay | Admitting: Interventional Cardiology

## 2015-08-14 DIAGNOSIS — I471 Supraventricular tachycardia: Secondary | ICD-10-CM

## 2015-09-13 ENCOUNTER — Other Ambulatory Visit: Payer: Self-pay | Admitting: Interventional Cardiology

## 2015-09-13 DIAGNOSIS — I471 Supraventricular tachycardia: Secondary | ICD-10-CM

## 2015-09-14 ENCOUNTER — Other Ambulatory Visit: Payer: Self-pay | Admitting: Interventional Cardiology

## 2015-09-14 DIAGNOSIS — I471 Supraventricular tachycardia, unspecified: Secondary | ICD-10-CM

## 2015-09-14 NOTE — Telephone Encounter (Signed)
New message  Pt c/o medication issue:  1. Name of Medication: Atenolol  2. How are you currently taking this medication (dosage and times per day)? 25mg  1x daily half morning- half evening   3. Are you having a reaction (difficulty breathing--STAT)? Per pt not while on med  4. What is your medication issue? Pt call requesting to speak with RN. Pt states she speak with Dr. Tamala Julian about winging herself off the medication. Pt states she could tell the difference when not taking the med and wants to know if she would be about to get a refill on this medication. Please call back to discuss

## 2015-09-14 NOTE — Telephone Encounter (Signed)
Pt was given instruction at her o/v to wen off Atenolol, if NO break through PSVT Atenolol could be d/c. Called pt after receiving the message that she felt better off the medication. Called pt  To discuss. Unable to lmom " message sts that this wireless caller is unavailable".

## 2015-09-15 NOTE — Telephone Encounter (Signed)
Pt states she tried to wean herself off of the Atenolol like recommended at last OV but it was unsuccessful.  Pt has been back to take Atenolol 12.5mg  BID for a few weeks.  Will forward to Dr. Tamala Julian to review and see if ok to refill Atenolol at current dose pt is taking?

## 2015-09-15 NOTE — Telephone Encounter (Signed)
Follow up        Pt c/o medication issue:  1. Name of Medication: atenolol 2. How are you currently taking this medication (dosage and times per day)? 25mg  in am and 25mg  in pm 3. Are you having a reaction (difficulty breathing--STAT)? no 4. What is your medication issue? Pt is returning a call to the nurse.  She want her atenolol refilled but talk to the nurse first.  Please send refill to [facility] [address]

## 2015-09-16 NOTE — Telephone Encounter (Signed)
It is OK to refill the medication. That her know that she could use the atenolol has a 25 mg tablet once a day.

## 2015-09-18 ENCOUNTER — Other Ambulatory Visit: Payer: Self-pay | Admitting: *Deleted

## 2015-09-18 MED ORDER — ATENOLOL 25 MG PO TABS: 25.0000 mg | ORAL_TABLET | Freq: Every day | ORAL | 3 refills | Status: DC

## 2015-09-18 NOTE — Telephone Encounter (Signed)
Pt aware ./CY

## 2015-10-30 ENCOUNTER — Other Ambulatory Visit: Payer: Self-pay | Admitting: Family Medicine

## 2015-10-30 DIAGNOSIS — Z1231 Encounter for screening mammogram for malignant neoplasm of breast: Secondary | ICD-10-CM

## 2015-11-14 DIAGNOSIS — F458 Other somatoform disorders: Secondary | ICD-10-CM | POA: Diagnosis not present

## 2015-11-16 DIAGNOSIS — D1801 Hemangioma of skin and subcutaneous tissue: Secondary | ICD-10-CM | POA: Diagnosis not present

## 2015-11-16 DIAGNOSIS — L821 Other seborrheic keratosis: Secondary | ICD-10-CM | POA: Diagnosis not present

## 2015-11-16 DIAGNOSIS — L814 Other melanin hyperpigmentation: Secondary | ICD-10-CM | POA: Diagnosis not present

## 2015-11-16 DIAGNOSIS — L918 Other hypertrophic disorders of the skin: Secondary | ICD-10-CM | POA: Diagnosis not present

## 2015-11-16 DIAGNOSIS — D225 Melanocytic nevi of trunk: Secondary | ICD-10-CM | POA: Diagnosis not present

## 2015-11-16 DIAGNOSIS — Z23 Encounter for immunization: Secondary | ICD-10-CM | POA: Diagnosis not present

## 2015-11-24 ENCOUNTER — Ambulatory Visit
Admission: RE | Admit: 2015-11-24 | Discharge: 2015-11-24 | Disposition: A | Discharge status: Home / Self Care | Payer: PPO | Source: Ambulatory Visit | Attending: Family Medicine | Admitting: Family Medicine

## 2015-11-24 DIAGNOSIS — Z1231 Encounter for screening mammogram for malignant neoplasm of breast: Secondary | ICD-10-CM | POA: Diagnosis not present

## 2016-02-27 DIAGNOSIS — J019 Acute sinusitis, unspecified: Secondary | ICD-10-CM | POA: Diagnosis not present

## 2016-03-05 DIAGNOSIS — Z01419 Encounter for gynecological examination (general) (routine) without abnormal findings: Secondary | ICD-10-CM | POA: Diagnosis not present

## 2016-03-05 DIAGNOSIS — L900 Lichen sclerosus et atrophicus: Secondary | ICD-10-CM | POA: Diagnosis not present

## 2016-03-05 DIAGNOSIS — M8588 Other specified disorders of bone density and structure, other site: Secondary | ICD-10-CM | POA: Diagnosis not present

## 2016-03-28 DIAGNOSIS — E782 Mixed hyperlipidemia: Secondary | ICD-10-CM | POA: Diagnosis not present

## 2016-03-28 DIAGNOSIS — Z0000 Encounter for general adult medical examination without abnormal findings: Secondary | ICD-10-CM | POA: Diagnosis not present

## 2016-04-01 DIAGNOSIS — S83282D Other tear of lateral meniscus, current injury, left knee, subsequent encounter: Secondary | ICD-10-CM | POA: Diagnosis not present

## 2016-04-04 DIAGNOSIS — M8588 Other specified disorders of bone density and structure, other site: Secondary | ICD-10-CM | POA: Diagnosis not present

## 2016-07-11 ENCOUNTER — Encounter: Payer: Self-pay | Admitting: Genetics

## 2016-07-11 ENCOUNTER — Ambulatory Visit (HOSPITAL_BASED_OUTPATIENT_CLINIC_OR_DEPARTMENT_OTHER): Payer: PPO | Admitting: Genetics

## 2016-07-11 ENCOUNTER — Ambulatory Visit: Payer: PPO

## 2016-07-11 DIAGNOSIS — Z315 Encounter for genetic counseling: Secondary | ICD-10-CM

## 2016-07-11 DIAGNOSIS — Z800 Family history of malignant neoplasm of digestive organs: Secondary | ICD-10-CM | POA: Diagnosis not present

## 2016-07-11 DIAGNOSIS — Z8481 Family history of carrier of genetic disease: Secondary | ICD-10-CM

## 2016-07-11 DIAGNOSIS — Z803 Family history of malignant neoplasm of breast: Secondary | ICD-10-CM

## 2016-07-11 NOTE — Progress Notes (Addendum)
REFERRING PROVIDER: Carol Ada, MD [address], [place] [address]  PRIMARY PROVIDER:  Carol Ada, MD  PRIMARY REASON FOR VISIT:  1. Family history of gene mutation   2. Family history of breast cancer   3. Family history of colon cancer   4. Family history of stomach cancer     HISTORY OF PRESENT ILLNESS:   [name]. [name], a 61 y.o. 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  [name] presents to clinic today to discuss the possibility of a hereditary predisposition to cancer, genetic testing, and to further clarify her future cancer risks, as well as potential cancer risks for family members.   CANCER HISTORY: [name] is a 61 y.o. female with no personal history of cancer. Per records sent by her referring physician, she had a normal colonoscopy in 05/2014 and was advised to return in 10 years. She also had a mammogram in 11/2015 that was BIRADS 1.   Past Medical History:  Diagnosis Date  . Arthritis   . Complication of anesthesia    sensitive to meds  . Dysrhythmia    hx psvt  . HOH (hard of hearing)   . IBS (irritable bowel syndrome)   . Lipoma    Left arm  . Seasonal allergies   . Tachycardia    PVTS    Past Surgical History:  Procedure Laterality Date  . BACK SURGERY  2009  . BACK SURGERY    . CARPAL TUNNEL RELEASE  2000   bilateral  . LIPOMA EXCISION  05/24/2011   Procedure: EXCISION LIPOMA;  Surgeon: Julie Burn. Georgette Dover, MD;  Location: [place];  Service: General;  Laterality: Left;  excision of left arm lipoma  . TONSILLECTOMY      Social History   Social History  . Marital status: Married    Spouse name: N/A  . Number of children: N/A  . Years of education: N/A   Social History Main Topics  . Smoking status:  Former Smoker    Quit date: 01/08/1987  . Smokeless tobacco: Never Used  . Alcohol use No  . Drug use: No  . Sexual activity: Not on file   Other Topics Concern  . Not on file   Social History Narrative  . No narrative on file     FAMILY HISTORY:  We obtained a detailed, 4-generation family history.  Significant diagnoses are listed below: Family History  Problem Relation Age of Onset  . COPD Mother   . Heart disease Mother   . Heart disease Father   . Breast cancer Maternal Aunt 75       d.80  . Breast cancer Sister 64  . Other Sister        CHEK2 mutation positive  . Stomach cancer Maternal Aunt 70       d.70  . Colon cancer Cousin        d.50s   [name] has a daughter, age 12, with no personal history of cancer. [name] also has three granddaughters, ages 32, 82, and 2. [name] has one sister, age 70, who was diagnosed with breast cancer around the age of 44. [name] reports that her sister's treatment included lumpectomy and radiation. Her sister also underwent genetic testing through [facility] panel on 03/12/2016. This testing revealed that [name]. [name]'s sister carries the pathogenic c.1100delC mutation in CHEK2. The testing also noted a variant of uncertain significance (VUS) in MLH1 called c.453G>A. A copy of [name]. [name] sister's genetic testing result report is included at the end of this note for reference. [name] also has a brother, age 6, who has no history of cancer. There are no known cancers in [name]'s nieces and nephews. However, one of her brother's sons had a tumor or cyst in his leg. This nephew has since passed away at the age of 88 in a car accident.  [name] mother died at 66 without cancers. [name] had three maternal aunts and three maternal uncles. One maternal aunt had breast cancer in her mid-41s and died at 18. This aunt's only child, a son, died in his 20s from colon cancer. Another maternal aunt was  diagnosed with and died of stomach cancer at age 34. [name] reports this as a stomach cancer, but notes that she is not entirely confident in this aunt's actual diagnosis. [name] third maternal aunt died at 35 without cancer. This aunt had two daughters. One daughter has a history of unspecified breast issues and the other daughter has a history of unspecified gynecologic issues. [name] is unsure if either have actually had cancer. [name] maternal uncles all died in their 90s without cancer. [name] maternal grandparents died in their 25s and 51s without cancers.  [name] father died at 64 without cancer. [name]. [name] had four paternal aunt (all full siblings to her father) and two paternal uncles (both maternal half-brothers to her father). All of her paternal aunts and uncles died after age 23 and none are known to have had cancer, but detailed information about [name]. [name]'s paternal family history is limited. Her paternal grandmother died in her 62s without cancer. Her paternal grandfather died before age 29 of unknown cause.  Patient's maternal ancestors are of [place] descent, and paternal ancestors are of [place] descent. There is no reported Ashkenazi Jewish ancestry. There is no known consanguinity.  GENETIC COUNSELING ASSESSMENT: [name] is a 61 y.o. female with a family history of a KFM in the CHEK2 gene and a family history of cancers. We discussed that due to the degree of biological relationship between [name]. [name] and her sister, there is a 50% (1 in 2) chance for [name]. [name] to also carry the c.1100delC mutation in CHEK2. Furthermore, some families have been found to carry multiple mutations associated with hereditary cancer risk. We, therefore, discussed and recommended the following at today's visit.   DISCUSSION: We reviewed the characteristics, features and inheritance patterns of hereditary cancer syndromes, with a specific focus on risks and  recommendations associated with CHEK2 mutations. We also discussed genetic testing, including the appropriate family members to test, the process of testing, insurance coverage and turn-around-time for results. We discussed the implications of a negative, positive and/or variant of uncertain significant result. We recommended [name]. Lemmerman pursue genetic testing for the 46-gene hereditary cancer panel offered by [facility]. [facility]'s Common Hereditary Cancers Panel includes analysis of the following 46 genes: APC, ATM, AXIN2, BARD1, BMPR1A, BRCA1, BRCA2, BRIP1, CDH1, CDKN2A, CHEK2, CTNNA1, DICER1, EPCAM, GREM1, HOXB13, KIT, MEN1, MLH1, MSH2, MSH3, MSH6, MUTYH, NBN, NF1, NTHL1, PALB2, PDGFRA, PMS2, POLD1, POLE, PTEN, RAD50, RAD51C, RAD51D, SDHA, SDHB, SDHC, SDHD, SMAD4, SMARCA4, STK11, TP53, TSC1, TSC2, and VHL.   Based on [name]. Fehnel's family history of cancer,  she meets medical criteria for genetic testing. Despite that she meets criteria, she may still have an out of pocket cost. We discussed that if her out of pocket cost for testing is over $100, the laboratory will call and confirm whether she wants to proceed with testing.  If the out of pocket cost of testing is less than $100 she will be billed by the genetic testing laboratory. Because [name]. Gibb expressed much concern regarding out-of-pocket expense, we requested that the lab contact her regarding ANY out-of-pocket expense.  PLAN: After considering the risks, benefits, and limitations, [name]. Krajewski  provided informed consent to pursue genetic testing and the blood sample was sent to [facility] for analysis of the 46-gene Common Hereditary Cancers Panel. Results should be available within approximately 3 weeks' time, at which point they will be disclosed by telephone to [name]. Pyper, as will any additional recommendations warranted by these results. This information will also be available in Epic.   Lastly, we encouraged [name]. Durley to remain in  contact with cancer genetics annually so that we can continuously update the family history and inform her of any changes in cancer genetics and testing that may be of benefit for this family.   [name].  [place]ast questions were answered to her satisfaction today. Our contact information was provided should additional questions or concerns arise. Thank you for the referral and allowing [place] to share in the care of your patient.   Mal Misty, [name], [facility] Certified Naval [email or web address] phone: [phone]  The patient was seen for a total of 45 minutes in face-to-face genetic counseling.   _______________________________________________________________________ For Office Staff:  Number of people involved in session: 1 Was an Intern/ student involved with case: no   [facility] Genetic Testing Result Report

## 2016-07-22 ENCOUNTER — Ambulatory Visit: Payer: Self-pay | Admitting: Genetics

## 2016-07-22 ENCOUNTER — Telehealth: Payer: Self-pay | Admitting: Genetics

## 2016-07-22 DIAGNOSIS — Z1502 Genetic susceptibility to malignant neoplasm of ovary: Secondary | ICD-10-CM

## 2016-07-22 DIAGNOSIS — Z1379 Encounter for other screening for genetic and chromosomal anomalies: Secondary | ICD-10-CM

## 2016-07-22 DIAGNOSIS — Z1589 Genetic susceptibility to other disease: Secondary | ICD-10-CM

## 2016-07-22 DIAGNOSIS — Z1501 Genetic susceptibility to malignant neoplasm of breast: Secondary | ICD-10-CM

## 2016-07-22 DIAGNOSIS — Z1509 Genetic susceptibility to other malignant neoplasm: Secondary | ICD-10-CM

## 2016-07-22 NOTE — Telephone Encounter (Deleted)
-----  Message from Mal Misty sent at 07/11/2016 10:40 AM EDT ----- Regarding: Call Results Sister carries 1100delC mutation in CHEK2 and MLH1 VUS. Sister tested through [facility]. Route to Dr. Carol Ada

## 2016-07-26 ENCOUNTER — Encounter: Payer: Self-pay | Admitting: Genetics

## 2016-07-26 DIAGNOSIS — Z1509 Genetic susceptibility to other malignant neoplasm: Secondary | ICD-10-CM

## 2016-07-26 DIAGNOSIS — Z1379 Encounter for other screening for genetic and chromosomal anomalies: Secondary | ICD-10-CM

## 2016-07-26 DIAGNOSIS — Z1501 Genetic susceptibility to malignant neoplasm of breast: Secondary | ICD-10-CM

## 2016-07-26 DIAGNOSIS — Z1502 Genetic susceptibility to malignant neoplasm of ovary: Secondary | ICD-10-CM

## 2016-07-26 DIAGNOSIS — Z1589 Genetic susceptibility to other disease: Secondary | ICD-10-CM

## 2016-07-26 HISTORY — DX: Encounter for other screening for genetic and chromosomal anomalies: Z13.79

## 2016-07-26 HISTORY — DX: Genetic susceptibility to malignant neoplasm of breast: Z15.01

## 2016-07-26 NOTE — Telephone Encounter (Signed)
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  Discussed associated risks and management recommendations. Recommended testing for family members. Discussed [facility]'s policy of free testing for [name]'s daughter and brother if tested within 67 days of 07/19/2016. [name] declined a return appointment to discuss these results in person. Please see encounter notes from 07/22/2016 for further discussion and a copy of the report. Result report is dated 07/19/2016.  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 Discussed that variants of uncertain significance should not be used for clinical management.

## 2016-07-26 NOTE — Progress Notes (Addendum)
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 Please refer to our prior cancer genetics clinic note for more information regarding [name]'s medical, social and family histories, and our assessment and recommendations, at the time. [name] recent genetic test results were disclosed to her, as were recommendations warranted by these results. These results and recommendations are discussed in more detail below.  CANCER HISTORY: [name] is a 61 y.o. female with no personal history of cancer. Per records sent by her referring physician, she had a normal colonoscopy in 05/2014 and was advised to return in 10 years. She also had a mammogram in 11/2015 that was BIRADS 1.   FAMILY HISTORY:  We obtained a detailed, 4-generation family history.  Significant diagnoses are listed below: Family History  Problem Relation Age of Onset  . COPD Mother   . Heart disease Mother   . Heart disease Father   . Breast cancer Maternal Aunt 75       d.80  . Breast cancer Sister 19  . Other Sister        CHEK2 mutation positive  . Stomach cancer Maternal Aunt 70       d.70  . Colon cancer Cousin        d.50s   [name] has a daughter, age 27, with no personal history of cancer. [name] also has three granddaughters, ages 72, 25, and 2. [name] has one sister, age 67, who was diagnosed with breast cancer around the age of 24. [name] reports that her sister's treatment included lumpectomy and radiation. Her sister also underwent genetic testing through [facility] on 03/12/2016. This testing revealed that [name]'s sister carries the pathogenic c.1100delC mutation in CHEK2. The testing also noted a variant of uncertain significance (VUS) in MLH1 called c.453G>A. A copy of [name] sister's genetic testing result report is included at the end of this note for reference. [name] also has a brother, age 87, who has no history of cancer. There are no known cancers in [name]'s nieces and nephews. However, one of her brother's sons had a tumor or cyst in his leg. This nephew has since passed away at the age of 31 in a car accident.  [name] mother died at 31 without cancers. [name] had three maternal aunts and three maternal uncles. One maternal aunt had breast cancer in her mid-19s and died at 16. This aunt's only child, a son, died in his 36s from colon cancer. Another maternal aunt was diagnosed with and died of stomach cancer at age 65. [name] reports this as a stomach cancer, but notes that she is not entirely confident in this aunt's actual diagnosis. [name] third maternal aunt died at 71 without cancer. This aunt had two daughters. One daughter has a history of unspecified breast issues and the other daughter has a history of unspecified gynecologic issues. [name] is unsure if either have actually had cancer. [name] maternal uncles all died in their 68s without cancer. [name] maternal grandparents died in their 57s and 61s without cancers.  [name] father died at 6 without cancer. [name] had four paternal aunt (all full siblings to her father) and two paternal uncles (both maternal half-brothers to her father). All of her  paternal aunts and uncles died after age 62 and none are known to have had cancer, but detailed information about [name]'s paternal family history is limited. Her paternal grandmother died in her 28s without cancer. Her paternal grandfather died before age 66 of unknown cause.  Patient's maternal ancestors are of [place] descent, and paternal ancestors are of [place] descent. There is no reported Ashkenazi Jewish ancestry. There is no known consanguinity.  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 This the same CHEK2 mutation that was previously identified in [name]'s sister.   There were no additional mutations in the remaining 45 genes analyzed: APC, ATM, AXIN2, BARD1, BMPR1A, BRCA1, BRCA2, BRIP1, CDH1, CDKN2A, CTNNA1, DICER1, EPCAM, GREM1, HOXB13, KIT, MEN1, MLH1, MSH2, MSH3, MSH6, MUTYH, NBN, NF1, NTHL1, PALB2, PDGFRA, PMS2, POLD1, POLE, PTEN, RAD50, RAD51C, RAD51D, SDHA, SDHB, SDHC, SDHD, SMAD4, SMARCA4, STK11, TP53, TSC1, TSC2, and VHL.  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 At this time, it is unknown if this variant is associated with increased cancer risk or if this is a normal finding, but most variants such as this get reclassified to being inconsequential. It should not be used to make medical management decisions. With time, we suspect the lab will determine the significance of this variant, if any. If we do learn more about it, we will try to contact [name] to discuss it further. However, it is important to stay in touch with [place] periodically and keep the address and phone number up to date.  The test report will be scanned into EPIC and will be located under the Molecular Pathology section of the Results Review tab.A portion of the result report is included below for reference.      DISCUSSION: CHEK2 mutations have been found to be associated with an increased risk of breast and other cancers. The estimated cancer risks vary widely and may be influenced by family history. Women with a CHEK2 deleterious mutation have approximately a 24% (no family history of breast cancer) to 48% (strong family history of breast cancer) lifetime risk of breast cancer and up to a 25% risk of a second breast  cancer. Men may have an increased risk for female breast cancer of about 1%. Men and women may have an increased risk of colon cancer (~10% lifetime risk). According to the NCCN guidelines, individuals with CHEK2 mutations should consider breast MRI's as a part of regular breast cancer screening, and depending on family history could consider a risk-reducing mastectomy.  Breast cancer screening should begin, for women, at age 74 or 33 years younger than the earliest age of onset.  Colon cancer screening should begin at age 10 and continue every 5 years or based on polyp number.    CANCER SCREENING: Current screening guidelines from the [employer]  (NCCN) for those with a pathogenic CHEK2 variant are as follows:  Breast cancer:  Annual mammogram with consideration of tomosynthesis; also consider breast MRI with contrast beginning by age 79 or 10 years prior to the earliest age at breast cancer diagnosis in the family.  Evidence of risk-reducing mastectomy is insufficient; manage based on family history (may be considered based on patient preference and family history).  Colon cancer:   Personal history of colon cancer  Follow instructions provided by your physician based on personal history of colon cancer and treatment.   Do not have a personal history of colon cancer but have a parent/sibling/child with colon cancer: Colonoscopy every 5 years starting at age 57 or 70 years younger than the earliest age of onset, whichever is younger.   Do not have a personal history of colon cancer and do not have a parent/sibling/child with colon cancer: Colonoscopy every 5 years starting at age 25.  Based on the recommendations above, we discussed that [name] should undergo annual breast MRIs in addition to her mammograms, alternating imaging techniques every 6 months. Though there is insufficient evidence to recommend prophylactic bilateral mastectomies due to  a CHEK2 mutation, [name] may consider this if she desires and should discuss further with her referring physician and perhaps a surgeon. [name] should also undergo colonoscopy every 5 years due to increased risk for colon cancer.  FAMILY MEMBERS: It is important that all of [name] relatives (both men and women) know of the presence of this gene mutation.  Women need to know that they may be at increased risk for breast and colon cancers.  Men may be at increased risk for colon cancer.  Genetic testing can sort out who in the family is at risk and who is not. We would be happy to meet with and coordinate genetic testing for any relative that is interested.  Ms. Treaster daughter and brother each have a 50% risk to have inherited this mutation. We recommend they have genetic testing for this same mutation, as identifying the presence of this mutation would allow them to also take advantage of risk-reducing measures. Until it can be determined which side of the family carries this CHEK2 mutation, Ms. Sulton was encouraged to inform cousins on both sides of her family of this mutation.  Our knowledge of cancer risks related to CHEK2 mutations will continue to evolve. We recommended that Ms. Tolle follow up with the genetics clinic annually so we can provide her with the most current information about CHEK2 and cancer risk, as well as with any changes to her family history (new cancer diagnoses, genetic test results).  SUPPORT AND RESOURCES:  We provided information about two support groups for hereditary cancer syndrome information and support, Facing Our Risk (www.facingourrisk.com) and Bright Pink (www.brightpink.org) which some people have found useful.  They provide opportunities to speak with other individuals from high-risk families.    PLAN:  1) Ms. Lansky plans to follow-up with her referring physician, Dr. Carol Ada, for orders and/or referrals required for the  management recommendations discussed above. Ms. Pennebaker was encouraged to contact me if she and Dr. Tamala Julian determine that they would like assistance in coordination of Ms. Alfieri on-going care for CHEK2-related risks. I would be happy to provide additional resources if needed.  2) Ms. Gregg declined a return appointment to discuss her results, but stated that she plans to attend her daughter's genetic counseling appointment once it is scheduled. She plans to have her daughter, Lisabeth Register DOB 02/25/1980, call us to schedule a genetic counseling appointment. Ms. Kupfer has given [place] verbal permission to discuss her genetic counseling and testing records with her daughter. 3) Our contact number was provided. Ms. Dejarnette questions were answered to her satisfaction, and she knows she is welcome to call us at anytime with additional questions or concerns.   4) Genetic counseling and testing records will be sent to Dr. Tamala Julian and also mailed  to Ms. Muhs address on file.  Mal Misty, MS, [facility] Certified [email or web address]

## 2016-08-01 DIAGNOSIS — F439 Reaction to severe stress, unspecified: Secondary | ICD-10-CM | POA: Diagnosis not present

## 2016-08-13 ENCOUNTER — Other Ambulatory Visit: Payer: Self-pay | Admitting: Interventional Cardiology

## 2016-08-13 NOTE — Telephone Encounter (Signed)
Should this be deferred to patients pcp as she is PRN follow up? Please advise. Thanks, MI

## 2016-08-13 NOTE — Telephone Encounter (Signed)
Yes, please. Thanks!

## 2016-09-16 ENCOUNTER — Other Ambulatory Visit: Payer: Self-pay | Admitting: *Deleted

## 2016-09-16 ENCOUNTER — Telehealth: Payer: Self-pay | Admitting: Interventional Cardiology

## 2016-09-16 MED ORDER — ATENOLOL 25 MG PO TABS: 25.0000 mg | ORAL_TABLET | Freq: Every day | ORAL | 0 refills | Status: DC

## 2016-09-16 NOTE — Telephone Encounter (Signed)
New message   Pt states she has one pill left.  *STAT* If patient is at the pharmacy, call can be transferred to refill team.   1. Which medications need to be refilled? (please list name of each medication and dose if known) atenolol 25 mg  2. Which pharmacy/location (including street and city if local pharmacy) is medication to be sent to? [facility] [address]  3. Do they need a 30 day or 90 day supply? 30 day

## 2016-10-07 DIAGNOSIS — M1712 Unilateral primary osteoarthritis, left knee: Secondary | ICD-10-CM | POA: Diagnosis not present

## 2016-10-14 ENCOUNTER — Other Ambulatory Visit: Payer: Self-pay | Admitting: Family Medicine

## 2016-10-14 DIAGNOSIS — Z1231 Encounter for screening mammogram for malignant neoplasm of breast: Secondary | ICD-10-CM

## 2016-10-31 NOTE — Progress Notes (Signed)
Cardiology Office Note    Date:  11/01/2016   ID:  [name], [name] [birth year], MRN [id number]  PCP:  Carol Ada, MD  Cardiologist: Sinclair Grooms, MD   Chief Complaint  Patient presents with  . Follow-up    Palpitations    History of Present Illness:  [name] is a 61 y.o. female follow-up PSVT controlled on beta blocker therapy.  She is doing well with few episodes of SVT. No medication side effects.   Past Surgical History:  Procedure Laterality Date  . BACK SURGERY  2009  . BACK SURGERY    . CARPAL TUNNEL RELEASE  2000   bilateral  . LIPOMA EXCISION  05/24/2011   Procedure: EXCISION LIPOMA;  Surgeon: Imogene Burn. Georgette Dover, MD;  Location: [facility];  Service: General;  Laterality: Left;  excision of left arm lipoma  . TONSILLECTOMY      Current Medications: Outpatient Medications Prior to Visit  Medication Sig Dispense Refill  . atenolol (TENORMIN) 25 MG tablet Take 1 tablet (25 mg total) by mouth daily. 90 tablet 0  . Clobetasol Prop Emollient Base (CLOBETASOL PROPIONATE E) 0.05 % emollient cream Apply one application to genital area once a week as needed for moisture.    Marland Kitchen ibuprofen (ADVIL,MOTRIN) 200 MG tablet Take 400 mg by mouth every 6 (six) hours as needed. headache    . loratadine (CLARITIN) 10 MG tablet Take 10 mg by mouth daily as needed. For allergies    . HYDROcodone-acetaminophen (NORCO/VICODIN) 5-325 MG per tablet Take 2 tablets by mouth every 4 (four) hours as needed for pain. (Patient not taking: Reported on 11/01/2016) 10 tablet 0  . naproxen (NAPROSYN) 375 MG tablet Take 375 mg by mouth as needed for mild pain.      No facility-administered medications prior to visit.      Allergies:   Patient has no known allergies.   Social History   Social History  . Marital status: Married    Spouse name: N/A  . Number of children: N/A  . Years of education: N/A   Social History Main Topics  . Smoking status: Former Smoker   Quit date: 01/08/1987  . Smokeless tobacco: Never Used  . Alcohol use No  . Drug use: No  . Sexual activity: Not Asked   Other Topics Concern  . None   Social History Narrative  . None     Family History:  The patient's family history includes Breast cancer (age of onset: 48) in her sister; Breast cancer (age of onset: 35) in her maternal aunt; COPD in her mother; Colon cancer in her cousin; Heart disease in her father and mother; Other in her sister; Stomach cancer (age of onset: 40) in her maternal aunt.   ROS:   Please see the history of present illness.    Back pain, anxiety, occasional snoring. Anxiety has resolved and sleep is easy or.  All other systems reviewed and are negative.   PHYSICAL EXAM:   VS:  BP 126/74   Pulse (!) 58   Ht 5\' 2"  (1.575 m)   Wt 168 lb (76.2 kg)   BMI 30.73 kg/m    GEN: Well nourished, well developed, in no acute distress  HEENT: normal  Neck: no JVD, carotid bruits, or masses Cardiac: RRR; no murmurs, rubs, or gallops,no edema  Respiratory:  clear to auscultation bilaterally, normal work of breathing GI: soft, nontender, nondistended, + BS MS: no deformity or atrophy  Skin: warm and dry, no rash Neuro:  Alert and Oriented x 3, Strength and sensation are intact Psych: euthymic mood, full affect  Wt Readings from Last 3 Encounters:  11/01/16 168 lb (76.2 kg)  07/28/15 164 lb 9.6 oz (74.7 kg)  07/07/14 167 lb (75.8 kg)      Studies/Labs Reviewed:   EKG:  EKG  Normal sinus rhythm with nonspecific T-wave flattening. Low voltage. No change compared to prior.  Recent Labs: No results found for requested labs within last 8760 hours.   Lipid Panel No results found for: CHOL, TRIG, HDL, CHOLHDL, VLDL, LDLCALC, LDLDIRECT  Additional studies/ records that were reviewed today include:  None    ASSESSMENT:    1. PSVT (paroxysmal supraventricular tachycardia) ([facility])   2. Obstructive sleep apnea   3. Other hyperlipidemia      PLAN:    In order of problems listed above:  1. Clinically stable. She will notify us if she has increased activity. 2. C Pap as prescribed. 3. LDL cholesterol is elevated. Risk factors are few. Encouraged low fat diet with increased plan based components.  Clinical follow-up in one year. Continue medications as listed.  Medication Adjustments/Labs and Tests Ordered: Current medicines are reviewed at length with the patient today.  Concerns regarding medicines are outlined above.  Medication changes, Labs and Tests ordered today are listed in the Patient Instructions below. Patient Instructions  Medication Instructions:  Your physician recommends that you continue on your current medications as directed. Please refer to the Current Medication list given to you today.  Labwork: None  Testing/Procedures: None  Follow-Up: Your physician wants you to follow-up in: 1 year with Dr. Tamala Julian.  You will receive a reminder letter in the mail two months in advance. If you don't receive a letter, please call our office to schedule the follow-up appointment.   Any Other Special Instructions Will Be Listed Below (If Applicable).     If you need a refill on your cardiac medications before your next appointment, please call your pharmacy.      Signed, Sinclair Grooms, MD  11/01/2016 9:55 AM    [address], [place], [place]  [address] Phone: [phone]; Fax: [phone]

## 2016-11-01 ENCOUNTER — Ambulatory Visit (INDEPENDENT_AMBULATORY_CARE_PROVIDER_SITE_OTHER): Payer: PPO | Admitting: Interventional Cardiology

## 2016-11-01 ENCOUNTER — Encounter: Payer: Self-pay | Admitting: Interventional Cardiology

## 2016-11-01 VITALS — BP 126/74 | HR 58 | Ht 62.0 in | Wt 168.0 lb

## 2016-11-01 DIAGNOSIS — E7849 Other hyperlipidemia: Secondary | ICD-10-CM | POA: Diagnosis not present

## 2016-11-01 DIAGNOSIS — I471 Supraventricular tachycardia: Secondary | ICD-10-CM | POA: Diagnosis not present

## 2016-11-01 DIAGNOSIS — G4733 Obstructive sleep apnea (adult) (pediatric): Secondary | ICD-10-CM | POA: Diagnosis not present

## 2016-11-01 NOTE — Patient Instructions (Signed)

## 2016-11-19 DIAGNOSIS — L918 Other hypertrophic disorders of the skin: Secondary | ICD-10-CM | POA: Diagnosis not present

## 2016-11-19 DIAGNOSIS — L814 Other melanin hyperpigmentation: Secondary | ICD-10-CM | POA: Diagnosis not present

## 2016-11-19 DIAGNOSIS — D225 Melanocytic nevi of trunk: Secondary | ICD-10-CM | POA: Diagnosis not present

## 2016-11-19 DIAGNOSIS — D1801 Hemangioma of skin and subcutaneous tissue: Secondary | ICD-10-CM | POA: Diagnosis not present

## 2016-11-19 DIAGNOSIS — Z23 Encounter for immunization: Secondary | ICD-10-CM | POA: Diagnosis not present

## 2016-11-19 DIAGNOSIS — L821 Other seborrheic keratosis: Secondary | ICD-10-CM | POA: Diagnosis not present

## 2016-11-25 ENCOUNTER — Ambulatory Visit
Admission: RE | Admit: 2016-11-25 | Discharge: 2016-11-25 | Disposition: A | Discharge status: Home / Self Care | Payer: PPO | Source: Ambulatory Visit | Attending: Family Medicine | Admitting: Family Medicine

## 2016-11-25 DIAGNOSIS — Z1231 Encounter for screening mammogram for malignant neoplasm of breast: Secondary | ICD-10-CM | POA: Diagnosis not present

## 2016-12-13 ENCOUNTER — Other Ambulatory Visit: Payer: Self-pay | Admitting: Interventional Cardiology

## 2017-03-31 DIAGNOSIS — E782 Mixed hyperlipidemia: Secondary | ICD-10-CM | POA: Diagnosis not present

## 2017-03-31 DIAGNOSIS — Z0000 Encounter for general adult medical examination without abnormal findings: Secondary | ICD-10-CM | POA: Diagnosis not present

## 2017-03-31 DIAGNOSIS — I471 Supraventricular tachycardia: Secondary | ICD-10-CM | POA: Diagnosis not present

## 2017-03-31 DIAGNOSIS — Z1389 Encounter for screening for other disorder: Secondary | ICD-10-CM | POA: Diagnosis not present

## 2017-05-15 DIAGNOSIS — J019 Acute sinusitis, unspecified: Secondary | ICD-10-CM | POA: Diagnosis not present

## 2017-09-17 DIAGNOSIS — M542 Cervicalgia: Secondary | ICD-10-CM | POA: Diagnosis not present

## 2017-09-17 DIAGNOSIS — M25519 Pain in unspecified shoulder: Secondary | ICD-10-CM | POA: Diagnosis not present

## 2017-09-23 DIAGNOSIS — I499 Cardiac arrhythmia, unspecified: Secondary | ICD-10-CM | POA: Diagnosis not present

## 2017-09-23 DIAGNOSIS — M542 Cervicalgia: Secondary | ICD-10-CM | POA: Diagnosis not present

## 2017-09-23 DIAGNOSIS — R5383 Other fatigue: Secondary | ICD-10-CM | POA: Diagnosis not present

## 2017-09-30 DIAGNOSIS — M5136 Other intervertebral disc degeneration, lumbar region: Secondary | ICD-10-CM | POA: Diagnosis not present

## 2017-09-30 DIAGNOSIS — M545 Low back pain: Secondary | ICD-10-CM | POA: Diagnosis not present

## 2017-09-30 DIAGNOSIS — M5417 Radiculopathy, lumbosacral region: Secondary | ICD-10-CM | POA: Diagnosis not present

## 2017-10-09 DIAGNOSIS — M542 Cervicalgia: Secondary | ICD-10-CM | POA: Diagnosis not present

## 2017-10-09 DIAGNOSIS — M5412 Radiculopathy, cervical region: Secondary | ICD-10-CM | POA: Diagnosis not present

## 2017-10-18 DIAGNOSIS — M5136 Other intervertebral disc degeneration, lumbar region: Secondary | ICD-10-CM | POA: Diagnosis not present

## 2017-10-18 DIAGNOSIS — M5412 Radiculopathy, cervical region: Secondary | ICD-10-CM | POA: Diagnosis not present

## 2017-10-21 DIAGNOSIS — M5416 Radiculopathy, lumbar region: Secondary | ICD-10-CM | POA: Insufficient documentation

## 2017-10-21 DIAGNOSIS — M545 Low back pain: Secondary | ICD-10-CM | POA: Diagnosis not present

## 2017-10-21 DIAGNOSIS — M519 Unspecified thoracic, thoracolumbar and lumbosacral intervertebral disc disorder: Secondary | ICD-10-CM | POA: Diagnosis not present

## 2017-10-21 DIAGNOSIS — M5136 Other intervertebral disc degeneration, lumbar region: Secondary | ICD-10-CM | POA: Diagnosis not present

## 2017-10-21 DIAGNOSIS — M50321 Other cervical disc degeneration at C4-C5 level: Secondary | ICD-10-CM | POA: Insufficient documentation

## 2017-10-21 DIAGNOSIS — M542 Cervicalgia: Secondary | ICD-10-CM | POA: Diagnosis not present

## 2017-10-23 ENCOUNTER — Other Ambulatory Visit: Payer: Self-pay | Admitting: Family Medicine

## 2017-10-23 DIAGNOSIS — Z1231 Encounter for screening mammogram for malignant neoplasm of breast: Secondary | ICD-10-CM
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
## 2017-11-03 ENCOUNTER — Encounter: Payer: Self-pay | Admitting: Interventional Cardiology

## 2017-11-03 ENCOUNTER — Ambulatory Visit: Payer: PPO | Admitting: Interventional Cardiology

## 2017-11-03 VITALS — BP 108/68 | HR 64 | Ht 62.0 in | Wt 171.2 lb

## 2017-11-03 DIAGNOSIS — I471 Supraventricular tachycardia: Secondary | ICD-10-CM | POA: Diagnosis not present

## 2017-11-03 DIAGNOSIS — E785 Hyperlipidemia, unspecified: Secondary | ICD-10-CM | POA: Diagnosis not present

## 2017-11-03 DIAGNOSIS — G4733 Obstructive sleep apnea (adult) (pediatric): Secondary | ICD-10-CM | POA: Diagnosis not present

## 2017-11-03 MED ORDER — ATENOLOL 25 MG PO TABS: 25.0000 mg | ORAL_TABLET | Freq: Every day | ORAL | 3 refills | Status: DC

## 2017-11-03 NOTE — Patient Instructions (Signed)
Medication Instructions:  Your physician recommends that you continue on your current medications as directed. Please refer to the Current Medication list given to you today.  If you need a refill on your cardiac medications before your next appointment, please call your pharmacy.   Lab work: none If you have labs (blood work) drawn today and your tests are completely normal, you will receive your results only by: Marland Kitchen MyChart Message (if you have MyChart) OR . A paper copy in the mail If you have any lab test that is abnormal or we need to change your treatment, we will call you to review the results.  Testing/Procedures: none  Follow-Up: At [facility], you and your health needs are our priority.  As part of our continuing mission to provide you with exceptional heart care, we have created designated Provider Care Teams.  These Care Teams include your primary Cardiologist (physician) and Advanced Practice Providers (APPs -  Physician Assistants and Nurse Practitioners) who all work together to provide you with the care you need, when you need it. You will need a follow up appointment in 12 months.  Please call our office 2 months in advance to schedule this appointment.  You may see Sinclair Grooms, MD or one of the following Advanced Practice Providers on your designated Care Team:   Truitt Merle, NP Cecilie Kicks, NP . Kathyrn Drown, NP  Any Other Special Instructions Will Be Listed Below (If Applicable).

## 2017-11-04 ENCOUNTER — Telehealth: Payer: Self-pay | Admitting: Interventional Cardiology

## 2017-11-04 NOTE — Telephone Encounter (Signed)
I removed the diagnosis from her problem list. Has been been there since 2015

## 2017-11-04 NOTE — Telephone Encounter (Signed)
Spoke with pt and she states she does not have OSA.  States she has never used a CPAP.  Pt noticed it on her AVS from yesterday and called today to see about getting that removed.  Advised I would send message to Dr. Tamala Julian to see what he can do.  OSA is noted in pt's notes back to 2015.

## 2017-11-04 NOTE — Telephone Encounter (Signed)
  Had appt yesterday with Dr Tamala Julian. When she got home she looked at her EKG strip and it said that she has obstructive sleep apnea on it and she wants to clarify that because she said she doesn't have that.

## 2017-11-23 DIAGNOSIS — J019 Acute sinusitis, unspecified: Secondary | ICD-10-CM | POA: Diagnosis not present

## 2017-11-23 DIAGNOSIS — J069 Acute upper respiratory infection, unspecified: Secondary | ICD-10-CM | POA: Diagnosis not present

## 2017-11-28 ENCOUNTER — Ambulatory Visit
Admission: RE | Admit: 2017-11-28 | Discharge: 2017-11-28 | Disposition: A | Discharge status: Home / Self Care | Payer: PPO | Source: Ambulatory Visit | Attending: Family Medicine | Admitting: Family Medicine

## 2017-11-28 ENCOUNTER — Ambulatory Visit: Payer: PPO

## 2017-11-28 DIAGNOSIS — Z1231 Encounter for screening mammogram for malignant neoplasm of breast: Secondary | ICD-10-CM | POA: Diagnosis not present

## 2017-12-02 ENCOUNTER — Telehealth: Payer: Self-pay | Admitting: Interventional Cardiology

## 2017-12-02 NOTE — Telephone Encounter (Signed)
Spoke with pt and made her aware that Dr. Tamala Julian did remove OSA from her problem list.  Pt appreciative for assistance.

## 2017-12-02 NOTE — Telephone Encounter (Signed)
New Message:   Patient stated some one was going to call her concerning something that was on her my chart that someone was suppose to look into. Pleas call patient back.

## 2018-01-21 DIAGNOSIS — H2513 Age-related nuclear cataract, bilateral: Secondary | ICD-10-CM | POA: Diagnosis not present

## 2018-02-10 DIAGNOSIS — J069 Acute upper respiratory infection, unspecified: Secondary | ICD-10-CM | POA: Diagnosis not present

## 2018-02-11 DIAGNOSIS — J0110 Acute frontal sinusitis, unspecified: Secondary | ICD-10-CM | POA: Diagnosis not present

## 2018-02-24 DIAGNOSIS — R0781 Pleurodynia: Secondary | ICD-10-CM | POA: Diagnosis not present

## 2018-03-03 DIAGNOSIS — H25812 Combined forms of age-related cataract, left eye: Secondary | ICD-10-CM | POA: Diagnosis not present

## 2018-03-03 DIAGNOSIS — H2512 Age-related nuclear cataract, left eye: Secondary | ICD-10-CM | POA: Diagnosis not present

## 2018-03-16 DIAGNOSIS — M940 Chondrocostal junction syndrome [Tietze]: Secondary | ICD-10-CM | POA: Diagnosis not present

## 2018-04-08 DIAGNOSIS — Z1389 Encounter for screening for other disorder: Secondary | ICD-10-CM | POA: Diagnosis not present

## 2018-04-08 DIAGNOSIS — I872 Venous insufficiency (chronic) (peripheral): Secondary | ICD-10-CM | POA: Diagnosis not present

## 2018-04-08 DIAGNOSIS — M25511 Pain in right shoulder: Secondary | ICD-10-CM | POA: Diagnosis not present

## 2018-04-08 DIAGNOSIS — R002 Palpitations: Secondary | ICD-10-CM | POA: Diagnosis not present

## 2018-04-08 DIAGNOSIS — M65332 Trigger finger, left middle finger: Secondary | ICD-10-CM | POA: Diagnosis not present

## 2018-04-08 DIAGNOSIS — R0781 Pleurodynia: Secondary | ICD-10-CM | POA: Diagnosis not present

## 2018-04-08 DIAGNOSIS — Z79899 Other long term (current) drug therapy: Secondary | ICD-10-CM | POA: Diagnosis not present

## 2018-04-08 DIAGNOSIS — E782 Mixed hyperlipidemia: Secondary | ICD-10-CM | POA: Diagnosis not present

## 2018-04-08 DIAGNOSIS — D649 Anemia, unspecified: Secondary | ICD-10-CM | POA: Diagnosis not present

## 2018-04-08 DIAGNOSIS — Z0000 Encounter for general adult medical examination without abnormal findings: Secondary | ICD-10-CM | POA: Diagnosis not present

## 2018-04-08 DIAGNOSIS — I10 Essential (primary) hypertension: Secondary | ICD-10-CM | POA: Diagnosis not present

## 2018-04-14 DIAGNOSIS — H1131 Conjunctival hemorrhage, right eye: Secondary | ICD-10-CM | POA: Diagnosis not present

## 2018-04-14 DIAGNOSIS — M7989 Other specified soft tissue disorders: Secondary | ICD-10-CM | POA: Diagnosis not present

## 2018-05-12 ENCOUNTER — Ambulatory Visit
Admission: RE | Admit: 2018-05-12 | Discharge: 2018-05-12 | Disposition: A | Discharge status: Home / Self Care | Payer: PPO | Source: Ambulatory Visit | Attending: Family Medicine | Admitting: Family Medicine

## 2018-05-12 ENCOUNTER — Other Ambulatory Visit: Payer: Self-pay

## 2018-05-12 ENCOUNTER — Other Ambulatory Visit: Payer: Self-pay | Admitting: Family Medicine

## 2018-05-12 DIAGNOSIS — R079 Chest pain, unspecified: Secondary | ICD-10-CM | POA: Diagnosis not present

## 2018-05-12 DIAGNOSIS — R0781 Pleurodynia: Secondary | ICD-10-CM | POA: Diagnosis not present

## 2018-07-07 DIAGNOSIS — Z23 Encounter for immunization: Secondary | ICD-10-CM | POA: Diagnosis not present

## 2018-07-07 DIAGNOSIS — S61419A Laceration without foreign body of unspecified hand, initial encounter: Secondary | ICD-10-CM | POA: Diagnosis not present

## 2018-07-21 DIAGNOSIS — N9089 Other specified noninflammatory disorders of vulva and perineum: Secondary | ICD-10-CM | POA: Diagnosis not present

## 2018-10-26 ENCOUNTER — Other Ambulatory Visit: Payer: Self-pay | Admitting: Family Medicine

## 2018-10-26 DIAGNOSIS — Z1231 Encounter for screening mammogram for malignant neoplasm of breast: Secondary | ICD-10-CM

## 2018-11-30 DIAGNOSIS — Z20828 Contact with and (suspected) exposure to other viral communicable diseases: Secondary | ICD-10-CM | POA: Diagnosis not present

## 2018-12-10 ENCOUNTER — Other Ambulatory Visit: Payer: Self-pay | Admitting: Interventional Cardiology

## 2018-12-10 MED ORDER — ATENOLOL 25 MG PO TABS: 25.0000 mg | ORAL_TABLET | Freq: Every day | ORAL | 0 refills | Status: DC

## 2018-12-10 NOTE — Telephone Encounter (Signed)
[name] medication was sent to pt's pharmacy as requested. Confirmation received.  °

## 2018-12-15 ENCOUNTER — Telehealth: Payer: Self-pay | Admitting: Interventional Cardiology

## 2018-12-15 NOTE — Telephone Encounter (Signed)
Spoke with pt and she is not having any issues.  Switched to video visit and obtained consent.       Virtual Visit Pre-Appointment Phone Call  "(Name), I am calling you today to discuss your upcoming appointment. We are currently trying to limit exposure to the virus that causes COVID-19 by seeing patients at home rather than in the office."  1. "What is the BEST phone number to call the day of the visit?" - include this in appointment notes  2. "Do you have or have access to (through a family member/friend) a smartphone with video capability that we can use for your visit?" a. If yes - list this number in appt notes as "cell" (if different from BEST phone #) and list the appointment type as a VIDEO visit in appointment notes b. If no - list the appointment type as a PHONE visit in appointment notes  3. Confirm consent - "In the setting of the current Covid19 crisis, you are scheduled for a (phone or video) visit with your provider on (date) at (time).  Just as we do with many in-office visits, in order for you to participate in this visit, we must obtain consent.  If you'd like, I can send this to your mychart (if signed up) or email for you to review.  Otherwise, I can obtain your verbal consent now.  All virtual visits are billed to your insurance company just like a normal visit would be.  By agreeing to a virtual visit, we'd like you to understand that the technology does not allow for your provider to perform an examination, and thus may limit your provider's ability to fully assess your condition. If your provider identifies any concerns that need to be evaluated in person, we will make arrangements to do so.  Finally, though the technology is pretty good, we cannot assure that it will always work on either your or our end, and in the setting of a video visit, we may have to convert it to a phone-only visit.  In either situation, we cannot ensure that we have a secure connection.  Are you  willing to proceed?" STAFF: Did the patient verbally acknowledge consent to telehealth visit? Document YES/NO here: YES  4. Advise patient to be prepared - "Two hours prior to your appointment, go ahead and check your blood pressure, pulse, oxygen saturation, and your weight (if you have the equipment to check those) and write them all down. When your visit starts, your provider will ask you for this information. If you have an Apple Watch or Kardia device, please plan to have heart rate information ready on the day of your appointment. Please have a pen and paper handy nearby the day of the visit as well."  5. Give patient instructions for MyChart download to smartphone OR Doximity/Doxy.me as below if video visit (depending on what platform provider is using)  6. Inform patient they will receive a phone call 15 minutes prior to their appointment time (may be from unknown caller ID) so they should be prepared to answer    TELEPHONE CALL NOTE  [name] has been deemed a candidate for a follow-up tele-health visit to limit community exposure during the Covid-19 pandemic. I spoke with the patient via phone to ensure availability of phone/video source, confirm preferred email & phone number, and discuss instructions and expectations.  I reminded [name] to be prepared with any vital sign and/or heart rhythm information that could potentially be  obtained via home monitoring, at the time of her visit. I reminded [name] to expect a phone call prior to her visit.  Loren Racer, RN 12/15/2018 3:38 PM   INSTRUCTIONS FOR DOWNLOADING THE MYCHART APP TO SMARTPHONE  - The patient must first make sure to have activated MyChart and know their login information - If Apple, go to [employer] and type in MyChart in the search bar and download the app. If Android, ask patient to go to [employer] and type in [place] in the search bar and download the app. The app is free but  as with any other app downloads, their phone may require them to verify saved payment information or Apple/Android password.  - The patient will need to then log into the app with their MyChart username and password, and select [facility] as their healthcare provider to link the account. When it is time for your visit, go to the MyChart app, find appointments, and click Begin Video Visit. Be sure to Select Allow for your device to access the Microphone and Camera for your visit. You will then be connected, and your provider will be with you shortly.  **If they have any issues connecting, or need assistance please contact MyChart service desk [phone])**  **If using a computer, in order to ensure the best quality for their visit they will need to use either of the following Internet Browsers: [employer], or Google Chrome**  IF USING DOXIMITY or DOXY.ME - The patient will receive a link just prior to their visit by text.     FULL LENGTH CONSENT FOR TELE-HEALTH VISIT   I hereby voluntarily request, consent and authorize [facility] and its employed or contracted physicians, physician assistants, nurse practitioners or other licensed health care professionals (the Practitioner), to provide me with telemedicine health care services (the "Services") as deemed necessary by the treating Practitioner. I acknowledge and consent to receive the Services by the Practitioner via telemedicine. I understand that the telemedicine visit will involve communicating with the Practitioner through live audiovisual communication technology and the disclosure of certain medical information by electronic transmission. I acknowledge that I have been given the opportunity to request an in-person assessment or other available alternative prior to the telemedicine visit and am voluntarily participating in the telemedicine visit.  I understand that I have the right to withhold or withdraw my consent to  the use of telemedicine in the course of my care at any time, without affecting my right to future care or treatment, and that the Practitioner or I may terminate the telemedicine visit at any time. I understand that I have the right to inspect all information obtained and/or recorded in the course of the telemedicine visit and may receive copies of available information for a reasonable fee.  I understand that some of the potential risks of receiving the Services via telemedicine include:  Marland Kitchen Delay or interruption in medical evaluation due to technological equipment failure or disruption; . Information transmitted may not be sufficient (e.g. poor resolution of images) to allow for appropriate medical decision making by the Practitioner; and/or  . In rare instances, security protocols could fail, causing a breach of personal health information.  Furthermore, I acknowledge that it is my responsibility to provide information about my medical history, conditions and care that is complete and accurate to the best of my ability. I acknowledge that Practitioner's advice, recommendations, and/or decision may be based on factors not within their control,  such as incomplete or inaccurate data provided by me or distortions of diagnostic images or specimens that may result from electronic transmissions. I understand that the practice of medicine is not an exact science and that Practitioner makes no warranties or guarantees regarding treatment outcomes. I acknowledge that I will receive a copy of this consent concurrently upon execution via email to the email address I last provided but may also request a printed copy by calling the office of [facility].    I understand that my insurance will be billed for this visit.   I have read or had this consent read to me. . I understand the contents of this consent, which adequately explains the benefits and risks of the Services being provided via telemedicine.  . I have  been provided ample opportunity to ask questions regarding this consent and the Services and have had my questions answered to my satisfaction. . I give my informed consent for the services to be provided through the use of telemedicine in my medical care  By participating in this telemedicine visit I agree to the above.

## 2018-12-15 NOTE — Telephone Encounter (Signed)
New Message    Pt is calling and is wondering if her appt with Dr Tamala Julian can be Virtual     Please call

## 2018-12-16 ENCOUNTER — Ambulatory Visit
Admission: RE | Admit: 2018-12-16 | Discharge: 2018-12-16 | Disposition: A | Discharge status: Home / Self Care | Payer: PPO | Source: Ambulatory Visit | Attending: Family Medicine | Admitting: Family Medicine

## 2018-12-16 ENCOUNTER — Other Ambulatory Visit: Payer: Self-pay

## 2018-12-16 ENCOUNTER — Telehealth (INDEPENDENT_AMBULATORY_CARE_PROVIDER_SITE_OTHER): Payer: PPO | Admitting: Interventional Cardiology

## 2018-12-16 ENCOUNTER — Encounter: Payer: Self-pay | Admitting: Interventional Cardiology

## 2018-12-16 VITALS — Ht 62.0 in | Wt 170.0 lb

## 2018-12-16 DIAGNOSIS — I471 Supraventricular tachycardia: Secondary | ICD-10-CM

## 2018-12-16 DIAGNOSIS — Z1231 Encounter for screening mammogram for malignant neoplasm of breast: Secondary | ICD-10-CM | POA: Diagnosis not present

## 2018-12-16 DIAGNOSIS — E785 Hyperlipidemia, unspecified: Secondary | ICD-10-CM

## 2018-12-16 DIAGNOSIS — Z7189 Other specified counseling: Secondary | ICD-10-CM

## 2018-12-16 NOTE — Patient Instructions (Signed)
Medication Instructions:  Your physician recommends that you continue on your current medications as directed. Please refer to the Current Medication list given to you today.  *If you need a refill on your cardiac medications before your next appointment, please call your pharmacy*  Lab Work: None If you have labs (blood work) drawn today and your tests are completely normal, you will receive your results only by: . MyChart Message (if you have MyChart) OR . A paper copy in the mail If you have any lab test that is abnormal or we need to change your treatment, we will call you to review the results.  Testing/Procedures: None  Follow-Up: At [facility], you and your health needs are our priority.  As part of our continuing mission to provide you with exceptional heart care, we have created designated Provider Care Teams.  These Care Teams include your primary Cardiologist (physician) and Advanced Practice Providers (APPs -  Physician Assistants and Nurse Practitioners) who all work together to provide you with the care you need, when you need it.  Your next appointment:   9-12 month(s)  The format for your next appointment:   In Person  Provider:   You may see Henry W Smith III, MD or one of the following Advanced Practice Providers on your designated Care Team:    Lori Gerhardt, NP  Laura Ingold, NP  Jill McDaniel, NP   Other Instructions   

## 2018-12-16 NOTE — Progress Notes (Signed)
Virtual Visit via Video Note   This visit type was conducted due to national recommendations for restrictions regarding the COVID-19 Pandemic (e.g. social distancing) in an effort to limit this patient's exposure and mitigate transmission in our community.  Due to her co-morbid illnesses, this patient is at least at moderate risk for complications without adequate follow up.  This format is felt to be most appropriate for this patient at this time.  All issues noted in this document were discussed and addressed.  A limited physical exam was performed with this format.  Please refer to the patient's chart for her consent to telehealth for [facility].   Date:  12/16/2018   ID:  [name], DOB 1955/07/06, MRN [id number]  Patient Location: Home Provider Location: Office  PCP:  Carol Ada, MD  Cardiologist:  Sinclair Grooms, MD  Electrophysiologist:  None   Evaluation Performed:  Follow-Up Visit  Chief Complaint: PSVT  History of Present Illness:    [name] is a 63 y.o. female with PSVT controlled on beta blocker therapy.  On low-dose atenolol, she is relatively asymptomatic.  No significant episodes of PSVT.  She further more denies shortness of breath, chest pain, orthopnea, lower extremity swelling, and syncope.  The patient does not have symptoms concerning for COVID-19 infection (fever, chills, cough, or new shortness of breath).    Past Medical History:  Diagnosis Date   Arthritis    Complication of anesthesia    sensitive to meds   Dysrhythmia    hx psvt   Genetic testing 07/26/2016   [name] underwent genetic counseling and testing for hereditary cancer syndromes on 07/11/2016. 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  The remaining 45 genes analyzed by [facility]'s 46-gene Common Hereditary Cancers Panel were negative for mutations. Genes analyzed include: APC, ATM, AXIN2, BARD1, BMPR1A, BRCA1,  BRCA2, BRIP1, CDH1, CDKN2A, CHE   HOH (hard of hearing)    IBS (irritable bowel syndrome)    Lipoma    Left arm   Monoallelic mutation of CHEK2 gene in female patient 07/26/2016   [name] underwent genetic counseling and testing for hereditary cancer syndromes on 07/11/2016. 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  The remaining 45 genes analyzed by [facility]'s 46-gene Common Hereditary Cancers Panel were negative for mutations. Genes analyzed include: APC, ATM, AXIN2, BARD1, BMPR1A, BRCA1, BRCA2, BRIP1, CDH1, CDKN2A, CHE   Seasonal allergies    Tachycardia    PVTS   Past Surgical History:  Procedure Laterality Date   BACK SURGERY  2009   BACK SURGERY     CARPAL TUNNEL RELEASE  2000   bilateral   LIPOMA EXCISION  05/24/2011   Procedure: EXCISION LIPOMA;  Surgeon: Imogene Burn. Georgette Dover, MD;  Location: [facility];  Service: General;  Laterality: Left;  excision of left arm lipoma   TONSILLECTOMY       No outpatient medications have been marked as taking for the 12/16/18 encounter (Appointment) with Belva Crome, MD.     Allergies:   Patient has no known allergies.   Social History   Tobacco Use   Smoking status: Former Smoker    Quit date: 01/08/1987    Years since quitting: 31.9   Smokeless tobacco: Never Used  Substance Use Topics   Alcohol use: No    Alcohol/week: 0.0 standard drinks   Drug use: No     Family Hx: The patient's family  history includes Breast cancer (age of onset: 70) in her sister; Breast cancer (age of onset: 75) in her maternal aunt; COPD in her mother; Colon cancer in her cousin; Heart disease in her father and mother; Other in her sister; Stomach cancer (age of onset: 52) in her maternal aunt.  ROS:   Please see the history of present illness.    Been exercising very much because of arthritis.  She used to do water aerobics before the pandemic.  She has lost some aerobic  conditioning. All other systems reviewed and are negative.   Prior CV studies:   The following studies were reviewed today:  No new data  Labs/Other Tests and Data Reviewed:    EKG:  No ECG reviewed.  Recent Labs: No results found for requested labs within last 8760 hours.   Recent Lipid Panel No results found for: CHOL, TRIG, HDL, CHOLHDL, LDLCALC, LDLDIRECT  Wt Readings from Last 3 Encounters:  11/03/17 171 lb 3.2 oz (77.7 kg)  11/01/16 168 lb (76.2 kg)  07/28/15 164 lb 9.6 oz (74.7 kg)     Objective:    Vital Signs:  There were no vitals taken for this visit.   VITAL SIGNS:  reviewed  ASSESSMENT & PLAN:    1. PSVT (paroxysmal supraventricular tachycardia) ([place])   2. Hyperlipidemia LDL goal <100   3. Educated about COVID-19 virus infection    PLAN:  1. No significant symptomatic PSVT on low-dose atenolol. 2. Not on statin therapy.  Most recent LDL was 144 in 2019.  This probably needs to be treated at her age.  Will react to this after her next lipid panel was done. 3. 3W's is being practiced.  COVID-19 Education: The signs and symptoms of COVID-19 were discussed with the patient and how to seek care for testing (follow up with PCP or arrange E-visit).  The importance of social distancing was discussed today.  Time:   Today, I have spent 10 minutes with the patient with telehealth technology discussing the above problems.     Medication Adjustments/Labs and Tests Ordered: Current medicines are reviewed at length with the patient today.  Concerns regarding medicines are outlined above.   Tests Ordered: No orders of the defined types were placed in this encounter.   Medication Changes: No orders of the defined types were placed in this encounter.   Follow Up:  In Person in 10 month(s)  Signed, Sinclair Grooms, MD  12/16/2018 12:58 PM    [place]

## 2018-12-30 ENCOUNTER — Ambulatory Visit: Payer: PPO | Admitting: Interventional Cardiology

## 2019-01-12 ENCOUNTER — Telehealth: Payer: Self-pay | Admitting: Interventional Cardiology

## 2019-01-12 NOTE — Telephone Encounter (Signed)
New Message     Pt is calling and is wondering if she is at risk for COVID  She is wondering if she is eligible to get the COVID vaccine     Please call

## 2019-01-12 NOTE — Telephone Encounter (Signed)
Spoke with pt and encouraged her to reach out to PCP or local health dept for guidance on when is the appropriate time for her to get the covid vaccine.  Pt appreciative for call.

## 2019-02-01 ENCOUNTER — Other Ambulatory Visit: Payer: Self-pay | Admitting: Interventional Cardiology

## 2019-02-03 ENCOUNTER — Telehealth: Payer: Self-pay | Admitting: Genetic Counselor

## 2019-02-03 NOTE — Telephone Encounter (Signed)
LM on VM that I was returning her call from yesterday.  I left CB instructions.

## 2019-02-16 DIAGNOSIS — B353 Tinea pedis: Secondary | ICD-10-CM | POA: Diagnosis not present

## 2019-03-22 DIAGNOSIS — J069 Acute upper respiratory infection, unspecified: Secondary | ICD-10-CM | POA: Diagnosis not present

## 2019-03-22 DIAGNOSIS — Z03818 Encounter for observation for suspected exposure to other biological agents ruled out: Secondary | ICD-10-CM | POA: Diagnosis not present

## 2019-04-05 DIAGNOSIS — R5383 Other fatigue: Secondary | ICD-10-CM | POA: Diagnosis not present

## 2019-04-05 DIAGNOSIS — R0602 Shortness of breath: Secondary | ICD-10-CM | POA: Diagnosis not present

## 2019-04-09 DIAGNOSIS — M25561 Pain in right knee: Secondary | ICD-10-CM | POA: Insufficient documentation

## 2019-04-20 DIAGNOSIS — E782 Mixed hyperlipidemia: Secondary | ICD-10-CM | POA: Diagnosis not present

## 2019-04-20 DIAGNOSIS — Z0000 Encounter for general adult medical examination without abnormal findings: Secondary | ICD-10-CM | POA: Diagnosis not present

## 2019-04-20 DIAGNOSIS — Z1389 Encounter for screening for other disorder: Secondary | ICD-10-CM | POA: Diagnosis not present

## 2019-07-04 DIAGNOSIS — Z03818 Encounter for observation for suspected exposure to other biological agents ruled out: Secondary | ICD-10-CM | POA: Diagnosis not present

## 2019-07-04 DIAGNOSIS — J0110 Acute frontal sinusitis, unspecified: Secondary | ICD-10-CM | POA: Diagnosis not present

## 2019-07-04 DIAGNOSIS — J069 Acute upper respiratory infection, unspecified: Secondary | ICD-10-CM | POA: Diagnosis not present

## 2019-07-04 DIAGNOSIS — R05 Cough: Secondary | ICD-10-CM | POA: Diagnosis not present

## 2019-08-11 DIAGNOSIS — M8580 Other specified disorders of bone density and structure, unspecified site: Secondary | ICD-10-CM | POA: Diagnosis not present

## 2019-08-11 DIAGNOSIS — R87610 Atypical squamous cells of undetermined significance on cytologic smear of cervix (ASC-US): Secondary | ICD-10-CM | POA: Diagnosis not present

## 2019-08-11 DIAGNOSIS — Z01419 Encounter for gynecological examination (general) (routine) without abnormal findings: Secondary | ICD-10-CM | POA: Diagnosis not present

## 2019-08-16 ENCOUNTER — Other Ambulatory Visit: Payer: Self-pay | Admitting: Nurse Practitioner

## 2019-08-16 DIAGNOSIS — M8580 Other specified disorders of bone density and structure, unspecified site: Secondary | ICD-10-CM

## 2019-08-25 ENCOUNTER — Telehealth: Payer: Self-pay | Admitting: Genetic Counselor

## 2019-08-25 NOTE — Telephone Encounter (Signed)
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 Over 90% of VUS's are benign.  She asked about her CHEK2 c.1100delC variant. Her sister showed this to her OB/GYN and they removed her ovaries, and the pateint was worried if this was needed for her.  I reviewed the NCCN criteria version 1.2022 which states that for CHEK2 there is no evidence of increased risk: No established association.

## 2019-09-01 DIAGNOSIS — R87619 Unspecified abnormal cytological findings in specimens from cervix uteri: Secondary | ICD-10-CM | POA: Diagnosis not present

## 2019-09-01 DIAGNOSIS — R898 Other abnormal findings in specimens from other organs, systems and tissues: Secondary | ICD-10-CM | POA: Diagnosis not present

## 2019-09-02 ENCOUNTER — Other Ambulatory Visit: Payer: Self-pay | Admitting: Nurse Practitioner

## 2019-09-02 DIAGNOSIS — Z1501 Genetic susceptibility to malignant neoplasm of breast: Secondary | ICD-10-CM

## 2019-09-04 DIAGNOSIS — J069 Acute upper respiratory infection, unspecified: Secondary | ICD-10-CM | POA: Diagnosis not present

## 2019-09-04 DIAGNOSIS — Z03818 Encounter for observation for suspected exposure to other biological agents ruled out: Secondary | ICD-10-CM | POA: Diagnosis not present

## 2019-09-04 DIAGNOSIS — R0981 Nasal congestion: Secondary | ICD-10-CM | POA: Diagnosis not present

## 2019-09-04 DIAGNOSIS — R05 Cough: Secondary | ICD-10-CM | POA: Diagnosis not present

## 2019-09-23 DIAGNOSIS — H2511 Age-related nuclear cataract, right eye: Secondary | ICD-10-CM | POA: Diagnosis not present

## 2019-09-23 DIAGNOSIS — H52202 Unspecified astigmatism, left eye: Secondary | ICD-10-CM | POA: Diagnosis not present

## 2019-09-23 DIAGNOSIS — H5211 Myopia, right eye: Secondary | ICD-10-CM | POA: Diagnosis not present

## 2019-09-27 DIAGNOSIS — H2511 Age-related nuclear cataract, right eye: Secondary | ICD-10-CM | POA: Diagnosis not present

## 2019-09-29 ENCOUNTER — Other Ambulatory Visit: Payer: Self-pay

## 2019-09-29 ENCOUNTER — Ambulatory Visit
Admission: RE | Admit: 2019-09-29 | Discharge: 2019-09-29 | Disposition: A | Discharge status: Home / Self Care | Payer: PPO | Source: Ambulatory Visit | Attending: Nurse Practitioner | Admitting: Nurse Practitioner

## 2019-09-29 DIAGNOSIS — N644 Mastodynia: Secondary | ICD-10-CM | POA: Diagnosis not present

## 2019-09-29 DIAGNOSIS — Z1509 Genetic susceptibility to other malignant neoplasm: Secondary | ICD-10-CM

## 2019-09-29 DIAGNOSIS — K7689 Other specified diseases of liver: Secondary | ICD-10-CM | POA: Diagnosis not present

## 2019-09-29 DIAGNOSIS — Z803 Family history of malignant neoplasm of breast: Secondary | ICD-10-CM | POA: Diagnosis not present

## 2019-09-29 MED ORDER — GADOBUTROL 1 MMOL/ML IV SOLN
8.0000 mL | Freq: Once | INTRAVENOUS | Status: AC | PRN
  Administered 2019-09-29: 8 mL via INTRAVENOUS

## 2019-10-02 NOTE — Progress Notes (Signed)
Cardiology Office Note:    Date:  10/04/2019   ID:  [name], DOB 04/24/1955, MRN [id number]  PCP:  Carol Ada, MD  Cardiologist:  Sinclair Grooms, MD   Referring MD: Carol Ada, MD   Chief Complaint  Patient presents with  . Advice Only    PSVT  . Hyperlipidemia    History of Present Illness:    [name] is a 64 y.o. female with a hx of  PSVT controlled on beta blocker therapy.  No significant complaints relative to heart rhythm or chest discomfort.  Decreased energy.  Last lipid panel was significantly abnormal.  Past Medical History:  Diagnosis Date  . Arthritis   . Complication of anesthesia    sensitive to meds  . Dysrhythmia    hx psvt  . Genetic testing 07/26/2016   Ms. Scheunemann underwent genetic counseling and testing for hereditary cancer syndromes on 07/11/2016. 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  The remaining 45 genes analyzed by [facility]'s 46-gene Common Hereditary Cancers Panel were negative for mutations. Genes analyzed include: APC, ATM, AXIN2, BARD1, BMPR1A, BRCA1, BRCA2, BRIP1, CDH1, CDKN2A, CHE  . HOH (hard of hearing)   . IBS (irritable bowel syndrome)   . Lipoma    Left arm  . Monoallelic mutation of CHEK2 gene in female patient 07/26/2016   Ms. Karwowski underwent genetic counseling and testing for hereditary cancer syndromes on 07/11/2016. 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  The remaining 45 genes analyzed by [facility]'s 46-gene Common Hereditary Cancers Panel were negative for mutations. Genes analyzed include: APC, ATM, AXIN2, BARD1, BMPR1A, BRCA1, BRCA2, BRIP1, CDH1, CDKN2A, CHE  . Seasonal allergies   . Tachycardia    PVTS    Past Surgical History:  Procedure Laterality Date  . BACK SURGERY  2009  . BACK SURGERY    . CARPAL TUNNEL RELEASE  2000   bilateral  . LIPOMA EXCISION  05/24/2011   Procedure: EXCISION  LIPOMA;  Surgeon: Imogene Burn. Georgette Dover, MD;  Location: [facility];  Service: General;  Laterality: Left;  excision of left arm lipoma  . TONSILLECTOMY      Current Medications: Current Meds  Medication Sig  . atenolol (TENORMIN) 25 MG tablet TAKE 1 TABLET BY MOUTH DAILY. PLEASE KEEP UPCOMING APPT  . Clobetasol Prop Emollient Base (CLOBETASOL PROPIONATE E) 0.05 % emollient cream Apply one application to genital area once a week as needed for moisture.  Marland Kitchen ibuprofen (ADVIL,MOTRIN) 200 MG tablet Take 400 mg by mouth every 6 (six) hours as needed. headache  . loratadine (CLARITIN) 10 MG tablet Take 10 mg by mouth daily as needed. For allergies  . traMADol (ULTRAM) 50 MG tablet Take 50 mg by mouth every 6 (six) hours as needed.     Allergies:   Patient has no known allergies.   Social History   Socioeconomic History  . Marital status: Married    Spouse name: Not on file  . Number of children: Not on file  . Years of education: Not on file  . Highest education level: Not on file  Occupational History  . Not on file  Tobacco Use  . Smoking status: Former Smoker    Quit date: 01/08/1987    Years since quitting: 32.7  . Smokeless tobacco: Never Used  Substance and Sexual Activity  . Alcohol use: No    Alcohol/week: 0.0 standard drinks  . Drug use: No  .  Sexual activity: Not on file  Other Topics Concern  . Not on file  Social History Narrative  . Not on file   Social Determinants of Health   Financial Resource Strain:   . Difficulty of Paying Living Expenses: Not on file  Food Insecurity:   . Worried About Charity fundraiser in the Last Year: Not on file  . Ran Out of Food in the Last Year: Not on file  Transportation Needs:   . Lack of Transportation (Medical): Not on file  . Lack of Transportation (Non-Medical): Not on file  Physical Activity:   . Days of Exercise per Week: Not on file  . Minutes of Exercise per Session: Not on file  Stress:   . Feeling of  Stress : Not on file  Social Connections:   . Frequency of Communication with Friends and Family: Not on file  . Frequency of Social Gatherings with Friends and Family: Not on file  . Attends Religious Services: Not on file  . Active Member of Clubs or Organizations: Not on file  . Attends Archivist Meetings: Not on file  . Marital Status: Not on file     Family History: The patient's family history includes Breast cancer (age of onset: 68) in her sister; Breast cancer (age of onset: 90) in her maternal aunt; COPD in her mother; Colon cancer in her cousin; Heart disease in her father and mother; Other in her sister; Stomach cancer (age of onset: 48) in her maternal aunt.  ROS:   Please see the history of present illness.    No energy.  She has received the COVID-19 vaccine.  She has an occasional flutter.  Chronically has no energy and is gaining weight.  All other systems reviewed and are negative.  EKGs/Labs/Other Studies Reviewed:    The following studies were reviewed today: No new or recent cardiac imaging  EKG:  EKG sinus rhythm, poor R wave progression V1 through V4.  Nonspecific T wave abnormality. No changes occurred when compared to October 2019. Recent Labs: No results found for requested labs within last 8760 hours.  Recent Lipid Panel No results found for: CHOL, TRIG, HDL, CHOLHDL, VLDL, LDLCALC, LDLDIRECT  Physical Exam:    VS:  BP 108/62   Pulse 66   Ht _0  (1.575 m)   Wt 175 lb 3.2 oz (79.5 kg)   SpO2 97%   BMI 32.04 kg/m     Wt Readings from Last 3 Encounters:  10/04/19 175 lb 3.2 oz (79.5 kg)  12/16/18 170 lb (77.1 kg)  11/03/17 171 lb 3.2 oz (77.7 kg)     GEN: Obese. No acute distress HEENT: Normal NECK: No JVD. LYMPHATICS: No lymphadenopathy CARDIAC:  RRR without murmur, gallop, or edema. VASCULAR:  Normal Pulses. No bruits. RESPIRATORY:  Clear to auscultation without rales, wheezing or rhonchi  ABDOMEN: Soft, non-tender,  non-distended, No pulsatile mass, MUSCULOSKELETAL: No deformity  SKIN: Warm and dry NEUROLOGIC:  Alert and oriented x 3 PSYCHIATRIC:  Normal affect   ASSESSMENT:    1. PSVT (paroxysmal supraventricular tachycardia) ([place])   2. Hyperlipidemia LDL goal <100   3. Educated about COVID-19 virus infection    PLAN:    In order of problems listed above:  1. Control with low-dose beta-blocker therapy.  No change. 2. 2 years ago, significantly abnormal lipid panel with total cholesterol 225 LDL 144 and HDL 66.  This needs to be repeated.  May need to consider coronary calcium score.  3. Booster is recommended.  Continue social medication.   Medication Adjustments/Labs and Tests Ordered: Current medicines are reviewed at length with the patient today.  Concerns regarding medicines are outlined above.  Orders Placed This Encounter  Procedures  . EKG 12-Lead   No orders of the defined types were placed in this encounter.   Patient Instructions  Medication Instructions:  Your physician recommends that you continue on your current medications as directed. Please refer to the Current Medication list given to you today.  *If you need a refill on your cardiac medications before your next appointment, please call your pharmacy*   Lab Work: None If you have labs (blood work) drawn today and your tests are completely normal, you will receive your results only by: Marland Kitchen MyChart Message (if you have MyChart) OR . A paper copy in the mail If you have any lab test that is abnormal or we need to change your treatment, we will call you to review the results.   Testing/Procedures: None   Follow-Up: At [facility], you and your health needs are our priority.  As part of our continuing mission to provide you with exceptional heart care, we have created designated Provider Care Teams.  These Care Teams include your primary Cardiologist (physician) and Advanced Practice Providers (APPs -  Physician  Assistants and Nurse Practitioners) who all work together to provide you with the care you need, when you need it.  We recommend signing up for the patient portal called "MyChart".  Sign up information is provided on this After Visit Summary.  MyChart is used to connect with patients for Virtual Visits (Telemedicine).  Patients are able to view lab/test results, encounter notes, upcoming appointments, etc.  Non-urgent messages can be sent to your provider as well.   To learn more about what you can do with MyChart, go to [email or web address].    Your next appointment:   12 month(s)  The format for your next appointment:   In Person  Provider:   You may see Sinclair Grooms, MD or one of the following Advanced Practice Providers on your designated Care Team:    Truitt Merle, NP  Cecilie Kicks, NP  Kathyrn Drown, NP    Other Instructions      Signed, Sinclair Grooms, MD  10/04/2019 4:07 PM    [place]

## 2019-10-04 ENCOUNTER — Ambulatory Visit: Payer: PPO | Admitting: Interventional Cardiology

## 2019-10-04 ENCOUNTER — Other Ambulatory Visit: Payer: Self-pay

## 2019-10-04 ENCOUNTER — Encounter: Payer: Self-pay | Admitting: Interventional Cardiology

## 2019-10-04 VITALS — BP 108/62 | HR 66 | Ht 62.0 in | Wt 175.2 lb

## 2019-10-04 DIAGNOSIS — Z7189 Other specified counseling: Secondary | ICD-10-CM | POA: Diagnosis not present

## 2019-10-04 DIAGNOSIS — E785 Hyperlipidemia, unspecified: Secondary | ICD-10-CM | POA: Diagnosis not present

## 2019-10-04 DIAGNOSIS — I471 Supraventricular tachycardia: Secondary | ICD-10-CM | POA: Diagnosis not present

## 2019-10-04 NOTE — Patient Instructions (Signed)

## 2019-10-08 ENCOUNTER — Emergency Department (HOSPITAL_BASED_OUTPATIENT_CLINIC_OR_DEPARTMENT_OTHER)
Admission: EM | Admit: 2019-10-08 | Discharge: 2019-10-09 | Disposition: A | Discharge status: Home / Self Care | Payer: PPO | Attending: Emergency Medicine | Admitting: Emergency Medicine

## 2019-10-08 ENCOUNTER — Other Ambulatory Visit: Payer: Self-pay

## 2019-10-08 ENCOUNTER — Encounter (HOSPITAL_BASED_OUTPATIENT_CLINIC_OR_DEPARTMENT_OTHER): Payer: Self-pay | Admitting: *Deleted

## 2019-10-08 DIAGNOSIS — Z79899 Other long term (current) drug therapy: Secondary | ICD-10-CM | POA: Diagnosis not present

## 2019-10-08 DIAGNOSIS — R1030 Lower abdominal pain, unspecified: Secondary | ICD-10-CM | POA: Diagnosis not present

## 2019-10-08 DIAGNOSIS — R1031 Right lower quadrant pain: Secondary | ICD-10-CM | POA: Diagnosis not present

## 2019-10-08 DIAGNOSIS — Z87891 Personal history of nicotine dependence: Secondary | ICD-10-CM | POA: Insufficient documentation

## 2019-10-08 DIAGNOSIS — R100 Acute abdomen: Secondary | ICD-10-CM | POA: Diagnosis not present

## 2019-10-08 DIAGNOSIS — R109 Unspecified abdominal pain: Secondary | ICD-10-CM | POA: Diagnosis not present

## 2019-10-08 LAB — URINALYSIS, ROUTINE W REFLEX MICROSCOPIC
Bilirubin Urine: NEGATIVE
Glucose, UA: NEGATIVE mg/dL
Hgb urine dipstick: NEGATIVE
Ketones, ur: NEGATIVE mg/dL
Leukocytes,Ua: NEGATIVE
Nitrite: NEGATIVE
Protein, ur: NEGATIVE mg/dL
Specific Gravity, Urine: 1.025 (ref 1.005–1.030)
pH: 6 (ref 5.0–8.0)

## 2019-10-08 LAB — CBC WITH DIFFERENTIAL/PLATELET
Abs Immature Granulocytes: 0.03 10*3/uL (ref 0.00–0.07)
Basophils Absolute: 0 10*3/uL (ref 0.0–0.1)
Basophils Relative: 0 %
Eosinophils Absolute: 0.1 10*3/uL (ref 0.0–0.5)
Eosinophils Relative: 1 %
HCT: 47.2 % — ABNORMAL HIGH (ref 36.0–46.0)
Hemoglobin: 15.2 g/dL — ABNORMAL HIGH (ref 12.0–15.0)
Immature Granulocytes: 0 %
Lymphocytes Relative: 23 %
Lymphs Abs: 2.1 10*3/uL (ref 0.7–4.0)
MCH: 29.3 pg (ref 26.0–34.0)
MCHC: 32.2 g/dL (ref 30.0–36.0)
MCV: 91.1 fL (ref 80.0–100.0)
Monocytes Absolute: 0.7 10*3/uL (ref 0.1–1.0)
Monocytes Relative: 8 %
Neutro Abs: 6.5 10*3/uL (ref 1.7–7.7)
Neutrophils Relative %: 68 %
Platelets: 282 10*3/uL (ref 150–400)
RBC: 5.18 MIL/uL — ABNORMAL HIGH (ref 3.87–5.11)
RDW: 13.6 % (ref 11.5–15.5)
WBC: 9.4 10*3/uL (ref 4.0–10.5)
nRBC: 0 % (ref 0.0–0.2)

## 2019-10-08 LAB — COMPREHENSIVE METABOLIC PANEL
ALT: 19 U/L (ref 0–44)
AST: 17 U/L (ref 15–41)
Albumin: 4.4 g/dL (ref 3.5–5.0)
Alkaline Phosphatase: 78 U/L (ref 38–126)
Anion gap: 9 (ref 5–15)
BUN: 21 mg/dL (ref 8–23)
CO2: 27 mmol/L (ref 22–32)
Calcium: 9.1 mg/dL (ref 8.9–10.3)
Chloride: 102 mmol/L (ref 98–111)
Creatinine, Ser: 0.64 mg/dL (ref 0.44–1.00)
GFR calc Af Amer: >60 mL/min (ref >60)
GFR calc non Af Amer: >60 mL/min (ref >60)
Glucose, Bld: 97 mg/dL (ref 70–99)
Potassium: 3.9 mmol/L (ref 3.5–5.1)
Sodium: 138 mmol/L (ref 135–145)
Total Bilirubin: 0.6 mg/dL (ref 0.3–1.2)
Total Protein: 8 g/dL (ref 6.5–8.1)

## 2019-10-08 LAB — LIPASE, BLOOD: Lipase: 33 U/L (ref 11–51)

## 2019-10-08 NOTE — ED Triage Notes (Signed)
Lower abdominal pain in the center of her lower abdomen, sudden onset while walking in the grocery stone.

## 2019-10-08 NOTE — ED Provider Notes (Signed)
[place] DEPT [facility] Provider Note: Julie Spurling, MD, FACEP  CSN: [id number] MRN: [id number] ARRIVAL: 10/08/19 at [place]: [place]  Abdominal Pain   HISTORY OF PRESENT ILLNESS  10/08/19 11:13 PM [name] is a 64 y.o. female who was ambulating in the grocery store about 1 PM today when she felt the fairly sudden onset of abdominal pain.  The pain began on the right side underneath her abdominal pannus and then moved to include the left side.  The pain is persisted.  She rates it as a 3 or 4 out of 10 currently.  She describes it as sharp and it is worse with movement or palpation.  There is no mass associated with this.  She denies any nausea, vomiting or diarrhea.   Past Medical History:  Diagnosis Date  . Arthritis   . Complication of anesthesia    sensitive to meds  . Dysrhythmia    hx psvt  . Genetic testing 07/26/2016   [name] underwent genetic counseling and testing for hereditary cancer syndromes on 07/11/2016. 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  The remaining 45 genes analyzed by [facility]'s 46-gene Common Hereditary Cancers Panel were negative for mutations. Genes analyzed include: APC, ATM, AXIN2, BARD1, BMPR1A, BRCA1, BRCA2, BRIP1, CDH1, CDKN2A, CHE  . HOH (hard of hearing)   . IBS (irritable bowel syndrome)   . Lipoma    Left arm  . Monoallelic mutation of CHEK2 gene in female patient 07/26/2016   [name] underwent genetic counseling and testing for hereditary cancer syndromes on 07/11/2016. 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  The remaining 45 genes analyzed by [facility]'s 46-gene Common Hereditary Cancers Panel were negative for mutations. Genes analyzed include: APC, ATM, AXIN2, BARD1, BMPR1A, BRCA1, BRCA2, BRIP1, CDH1, CDKN2A, CHE  . Seasonal allergies   . Tachycardia    PVTS    Past Surgical History:  Procedure  Laterality Date  . BACK SURGERY  2009  . BACK SURGERY    . CARPAL TUNNEL RELEASE  2000   bilateral  . LIPOMA EXCISION  05/24/2011   Procedure: EXCISION LIPOMA;  Surgeon: Imogene Burn. Georgette Dover, MD;  Location: [place];  Service: General;  Laterality: Left;  excision of left arm lipoma  . TONSILLECTOMY      Family History  Problem Relation Age of Onset  . COPD Mother   . Heart disease Mother   . Heart disease Father   . Breast cancer Maternal Aunt 75       d.80  . Breast cancer Sister 78  . Other Sister        CHEK2 mutation positive  . Stomach cancer Maternal Aunt 70       d.70  . Colon cancer Cousin        d.50s    Social History   Tobacco Use  . Smoking status: Former Smoker    Quit date: 01/08/1987    Years since quitting: 32.7  . Smokeless tobacco: Never Used  Substance Use Topics  . Alcohol use: No    Alcohol/week: 0.0 standard drinks  . Drug use: No    Prior to Admission medications   Medication Sig Start Date End Date Taking? Authorizing Provider  atenolol (TENORMIN) 25 MG tablet TAKE 1 TABLET BY MOUTH DAILY. PLEASE KEEP UPCOMING APPT 02/02/19   Belva Crome, MD  Clobetasol Prop Emollient Base (CLOBETASOL PROPIONATE E) 0.05 % emollient  cream Apply one application to genital area once a week as needed for moisture.    [provider]  loratadine (CLARITIN) 10 MG tablet Take 10 mg by mouth daily as needed. For allergies    [provider]    Allergies Patient has no known allergies.   REVIEW OF SYSTEMS  Negative except as noted here or in the History of Present Illness.   PHYSICAL EXAMINATION  Initial Vital Signs Blood pressure 138/90, pulse 66, temperature 97.8 F (36.6 C), temperature source Oral, resp. rate 16, height 5' 2"  (1.575 m), weight 79.5 kg, SpO2 100 %.  Examination General: Well-developed, well-nourished female in no acute distress; appearance consistent with age of record HENT: normocephalic; atraumatic Eyes:  Left pseudophakia; slight anisocoria (right greater than left); extraocular muscles intact Neck: supple Heart: regular rate and rhythm Lungs: clear to auscultation bilaterally Abdomen: soft; nondistended; lower abdominal tenderness just below the abdominal pannus bilaterally; no masses or hepatosplenomegaly; bowel sounds present Extremities: No deformity; full range of motion; pulses normal Neurologic: Awake, alert and oriented; motor function intact in all extremities and symmetric; no facial droop Skin: Warm and dry Psychiatric: Normal mood and affect   RESULTS  Summary of this visit's results, reviewed and interpreted by myself:   EKG Interpretation  Date/Time:    Ventricular Rate:    PR Interval:    QRS Duration:   QT Interval:    QTC Calculation:   R Axis:     Text Interpretation:        Laboratory Studies: Results for orders placed or performed during the hospital encounter of 10/08/19 (from the past 24 hour(s))  Urinalysis, Routine w reflex microscopic Urine, Clean Catch     Status: None   Collection Time: 10/08/19  7:30 PM  Result Value Ref Range   Color, Urine YELLOW YELLOW   APPearance CLEAR CLEAR   Specific Gravity, Urine 1.025 1.005 - 1.030   pH 6.0 5.0 - 8.0   Glucose, UA NEGATIVE NEGATIVE mg/dL   Hgb urine dipstick NEGATIVE NEGATIVE   Bilirubin Urine NEGATIVE NEGATIVE   Ketones, ur NEGATIVE NEGATIVE mg/dL   Protein, ur NEGATIVE NEGATIVE mg/dL   Nitrite NEGATIVE NEGATIVE   Leukocytes,Ua NEGATIVE NEGATIVE  CBC with Differential     Status: Abnormal   Collection Time: 10/08/19  7:30 PM  Result Value Ref Range   WBC 9.4 4.0 - 10.5 K/uL   RBC 5.18 (H) 3.87 - 5.11 MIL/uL   Hemoglobin 15.2 (H) 12.0 - 15.0 g/dL   HCT 47.2 (H) 36 - 46 %   MCV 91.1 80.0 - 100.0 fL   MCH 29.3 26.0 - 34.0 pg   MCHC 32.2 30.0 - 36.0 g/dL   RDW 13.6 11.5 - 15.5 %   Platelets 282 150 - 400 K/uL   nRBC 0.0 0.0 - 0.2 %   Neutrophils Relative % 68 %   Neutro Abs 6.5 1.7 - 7.7 K/uL    Lymphocytes Relative 23 %   Lymphs Abs 2.1 0.7 - 4.0 K/uL   Monocytes Relative 8 %   Monocytes Absolute 0.7 0 - 1 K/uL   Eosinophils Relative 1 %   Eosinophils Absolute 0.1 0 - 0 K/uL   Basophils Relative 0 %   Basophils Absolute 0.0 0 - 0 K/uL   Immature Granulocytes 0 %   Abs Immature Granulocytes 0.03 0.00 - 0.07 K/uL  Lipase, blood     Status: None   Collection Time: 10/08/19  7:30 PM  Result Value Ref  Range   Lipase 33 11 - 51 U/L  Comprehensive metabolic panel     Status: None   Collection Time: 10/08/19  7:30 PM  Result Value Ref Range   Sodium 138 135 - 145 mmol/L   Potassium 3.9 3.5 - 5.1 mmol/L   Chloride 102 98 - 111 mmol/L   CO2 27 22 - 32 mmol/L   Glucose, Bld 97 70 - 99 mg/dL   BUN 21 8 - 23 mg/dL   Creatinine, Ser 0.64 0.44 - 1.00 mg/dL   Calcium 9.1 8.9 - 10.3 mg/dL   Total Protein 8.0 6.5 - 8.1 g/dL   Albumin 4.4 3.5 - 5.0 g/dL   AST 17 15 - 41 U/L   ALT 19 0 - 44 U/L   Alkaline Phosphatase 78 38 - 126 U/L   Total Bilirubin 0.6 0.3 - 1.2 mg/dL   GFR calc non Af Amer >60 >60 mL/min   GFR calc Af Amer >60 >60 mL/min   Anion gap 9 5 - 15   Imaging Studies: CT ABDOMEN PELVIS W CONTRAST  Result Date: 10/09/2019 CLINICAL DATA:  Lower abdominal pain EXAM: CT ABDOMEN AND PELVIS WITH CONTRAST TECHNIQUE: Multidetector CT imaging of the abdomen and pelvis was performed using the standard protocol following bolus administration of intravenous contrast. CONTRAST:  110m OMNIPAQUE IOHEXOL 300 MG/ML  SOLN COMPARISON:  None. FINDINGS: Lower chest: The visualized heart size within normal limits. No pericardial fluid/thickening. No hiatal hernia. The visualized portions of the lungs are clear. Hepatobiliary: Multiple low-density lesions are seen throughout the liver parenchyma the largest in the posterior right liver lobe measuring 3.9 cm, likely hepatic cyst.The main portal vein is patent. No evidence of calcified gallstones, gallbladder wall thickening or biliary dilatation.  Pancreas: Unremarkable. No pancreatic ductal dilatation or surrounding inflammatory changes. Spleen: Normal in size without focal abnormality. Adrenals/Urinary Tract: Both adrenal glands appear normal. The kidneys and collecting system appear normal without evidence of urinary tract calculus or hydronephrosis. Bladder is unremarkable. Stomach/Bowel: The stomach, small bowel, and colon are normal in appearance. No inflammatory changes, wall thickening, or obstructive findings.The appendix is normal. Vascular/Lymphatic: There are no enlarged mesenteric, retroperitoneal, or pelvic lymph nodes. Scattered minimal aortic atherosclerosis seen within the distal abdominal aorta. Reproductive: The uterus and adnexa are unremarkable. Other: No evidence of abdominal wall mass or hernia. Musculoskeletal: No acute or significant osseous findings. IMPRESSION: No acute intra-abdominal or pelvic pathology to explain the patient's symptoms. Electronically Signed   By: BPrudencio PairM.D.   On: 10/09/2019 00:30    ED COURSE and MDM  Nursing notes, initial and subsequent vitals signs, including pulse oximetry, reviewed and interpreted by myself.  Vitals:   10/08/19 1926 10/08/19 2156 10/08/19 2157 10/09/19 0028  BP: (!) 151/94  138/90 130/90  Pulse: 66  66 73  Resp: 18  16 18   Temp: 98.2 F (36.8 C)  97.8 F (36.6 C)   TempSrc: Oral  Oral   SpO2: 100% 100% 100% 100%  Weight:      Height:       Medications  iohexol (OMNIPAQUE) 300 MG/ML solution 100 mL (100 mLs Intravenous Contrast Given 10/09/19 0009)   12:45 AM Patient's pain improving without analgesics.  She was advised of reassuring laboratory studies and CT scan.  She should return if symptoms worsen but I anticipate continued improvement.  PROCEDURES  Procedures   ED DIAGNOSES     ICD-10-CM   1. Lower abdominal pain  R10.30  Fields Oros, Jenny Reichmann, MD 10/09/19 [id number]

## 2019-10-09 ENCOUNTER — Encounter (HOSPITAL_BASED_OUTPATIENT_CLINIC_OR_DEPARTMENT_OTHER): Payer: Self-pay

## 2019-10-09 ENCOUNTER — Emergency Department (HOSPITAL_BASED_OUTPATIENT_CLINIC_OR_DEPARTMENT_OTHER): Payer: PPO

## 2019-10-09 DIAGNOSIS — R109 Unspecified abdominal pain: Secondary | ICD-10-CM | POA: Diagnosis not present

## 2019-10-09 MED ORDER — IOHEXOL 300 MG/ML  SOLN
100.0000 mL | Freq: Once | INTRAMUSCULAR | Status: AC | PRN
  Administered 2019-10-09: 100 mL via INTRAVENOUS

## 2019-10-15 DIAGNOSIS — E782 Mixed hyperlipidemia: Secondary | ICD-10-CM | POA: Diagnosis not present

## 2019-10-26 DIAGNOSIS — H2511 Age-related nuclear cataract, right eye: Secondary | ICD-10-CM | POA: Diagnosis not present

## 2019-10-26 DIAGNOSIS — H25811 Combined forms of age-related cataract, right eye: Secondary | ICD-10-CM | POA: Diagnosis not present

## 2019-11-05 DIAGNOSIS — M79642 Pain in left hand: Secondary | ICD-10-CM | POA: Diagnosis not present

## 2019-11-05 DIAGNOSIS — M65332 Trigger finger, left middle finger: Secondary | ICD-10-CM | POA: Diagnosis not present

## 2019-11-23 ENCOUNTER — Ambulatory Visit: Payer: PPO | Admitting: Podiatry

## 2019-11-23 ENCOUNTER — Other Ambulatory Visit: Payer: Self-pay

## 2019-11-23 DIAGNOSIS — I7300 Raynaud's syndrome without gangrene: Secondary | ICD-10-CM | POA: Diagnosis not present

## 2019-11-23 DIAGNOSIS — B353 Tinea pedis: Secondary | ICD-10-CM | POA: Diagnosis not present

## 2019-11-23 NOTE — Progress Notes (Signed)
   HPI: 64 y.o. female presenting today for evaluation of discoloration with itching to the bilateral second digits.  Patient states that especially during the winter months she started to get cold feet that become itchy intermittently.  Also during the middle of the night.  She tries to wear warm socks with minimal relief.  She presents for further treatment evaluation  Past Medical History:  Diagnosis Date  . Arthritis   . Complication of anesthesia    sensitive to meds  . Dysrhythmia    hx psvt  . Genetic testing 07/26/2016   Ms. Sylvestre underwent genetic counseling and testing for hereditary cancer syndromes on 07/11/2016. 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  The remaining 45 genes analyzed by [facility]'s 46-gene Common Hereditary Cancers Panel were negative for mutations. Genes analyzed include: APC, ATM, AXIN2, BARD1, BMPR1A, BRCA1, BRCA2, BRIP1, CDH1, CDKN2A, CHE  . HOH (hard of hearing)   . IBS (irritable bowel syndrome)   . Lipoma    Left arm  . Monoallelic mutation of CHEK2 gene in female patient 07/26/2016   Ms. Fernandez underwent genetic counseling and testing for hereditary cancer syndromes on 07/11/2016. 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  The remaining 45 genes analyzed by [facility]'s 46-gene Common Hereditary Cancers Panel were negative for mutations. Genes analyzed include: APC, ATM, AXIN2, BARD1, BMPR1A, BRCA1, BRCA2, BRIP1, CDH1, CDKN2A, CHE  . Seasonal allergies   . Tachycardia    PVTS     Physical Exam: General: The patient is alert and oriented x3 in no acute distress.  Dermatology: Skin is warm, dry and supple bilateral lower extremities. Negative for open lesions or macerations.  Vascular: Palpable pedal pulses bilaterally. No edema or erythema noted. Capillary refill slightly delayed to the digits of the bilateral feet.  There is also some slight bluish/reddish  discoloration to the tips of the toes consistent with a Raynaud's phenomenon.  Neurological: Epicritic and protective threshold grossly intact bilaterally.   Musculoskeletal Exam: Range of motion within normal limits to all pedal and ankle joints bilateral. Muscle strength 5/5 in all groups bilateral.   Assessment: 1.  Raynaud's phenomenon second digits bilateral 2.  Possible tinea pedis bilateral 3.  Morton's toe second digit bilateral   Plan of Care:  1. Patient evaluated.  2.  Patient has a prescription for Lotrisone cream.  Continue to apply Lotrisone cream as needed 3.  Silicone toe caps were provided for the patient to prevent rubbing of the second digit to the adjacent toes 4.  Explained the ray nods phenomenon and that she should always keep her feet warm especially during the winter months 5.  Return to clinic as needed      Edrick Kins, DPM [facility]  Dr. Edrick Kins, DPM    [address], [place] [address]                Office [phone]  Fax [phone]

## 2019-12-09 DIAGNOSIS — J0100 Acute maxillary sinusitis, unspecified: Secondary | ICD-10-CM | POA: Diagnosis not present

## 2019-12-09 DIAGNOSIS — R058 Other specified cough: Secondary | ICD-10-CM | POA: Diagnosis not present

## 2019-12-11 DIAGNOSIS — Z03818 Encounter for observation for suspected exposure to other biological agents ruled out: Secondary | ICD-10-CM | POA: Diagnosis not present

## 2019-12-11 DIAGNOSIS — R6883 Chills (without fever): Secondary | ICD-10-CM | POA: Diagnosis not present

## 2019-12-11 DIAGNOSIS — R059 Cough, unspecified: Secondary | ICD-10-CM | POA: Diagnosis not present

## 2019-12-11 DIAGNOSIS — B349 Viral infection, unspecified: Secondary | ICD-10-CM | POA: Diagnosis not present

## 2019-12-13 ENCOUNTER — Other Ambulatory Visit: Payer: Self-pay | Admitting: Family Medicine

## 2019-12-13 DIAGNOSIS — Z1231 Encounter for screening mammogram for malignant neoplasm of breast: Secondary | ICD-10-CM

## 2019-12-17 DIAGNOSIS — M79642 Pain in left hand: Secondary | ICD-10-CM | POA: Diagnosis not present

## 2019-12-17 DIAGNOSIS — M65332 Trigger finger, left middle finger: Secondary | ICD-10-CM | POA: Diagnosis not present

## 2020-01-17 DIAGNOSIS — D225 Melanocytic nevi of trunk: Secondary | ICD-10-CM | POA: Diagnosis not present

## 2020-01-17 DIAGNOSIS — L578 Other skin changes due to chronic exposure to nonionizing radiation: Secondary | ICD-10-CM | POA: Diagnosis not present

## 2020-01-17 DIAGNOSIS — L821 Other seborrheic keratosis: Secondary | ICD-10-CM | POA: Diagnosis not present

## 2020-01-17 DIAGNOSIS — L814 Other melanin hyperpigmentation: Secondary | ICD-10-CM | POA: Diagnosis not present

## 2020-01-25 ENCOUNTER — Ambulatory Visit: Payer: PPO

## 2020-02-23 ENCOUNTER — Telehealth: Payer: Self-pay | Admitting: Interventional Cardiology

## 2020-02-23 DIAGNOSIS — E785 Hyperlipidemia, unspecified: Secondary | ICD-10-CM

## 2020-02-23 NOTE — Telephone Encounter (Signed)
    Pt said her pcp send a copy of her lipids result. She would like for Dr. Tamala Julian to check it and see if he has any recommendation for her

## 2020-03-02 ENCOUNTER — Other Ambulatory Visit: Payer: Self-pay | Admitting: Interventional Cardiology

## 2020-03-02 NOTE — Telephone Encounter (Signed)
Labs are now available in Epic.  Should be the first thing in the lab tab and just click the link for the scanned labs.  LDL is 160.

## 2020-03-03 NOTE — Telephone Encounter (Signed)
Please review labs.  Pt wanted to see if you had any recommendations in regards to these labs.  Spoke with her to see what PCP said about LDL.  She states they offered low dose statin or diet and exercise.  She chose diet and exercise at the time.  Labs have not been rechecked.  Pt has now decided that she would like to try a low dose statin.  She prefers to try Rosuvastatin because that is what her husband is on and he told her that he has had no issues with it.  Pt does have issues with arthritis so was hesitant at first about meds but willing to try at a low dose.  Advised I will forward to Dr. Tamala Julian for review.

## 2020-03-07 ENCOUNTER — Ambulatory Visit: Payer: PPO

## 2020-03-08 NOTE — Telephone Encounter (Signed)
Spoke with pt and made her aware of recommendations.  Pt agreeable to plan.  Advised I will place order and scheduling dept will be in contact.

## 2020-03-08 NOTE — Telephone Encounter (Signed)
She needs a Calcium Score to decide if she has to be on therapy.

## 2020-03-08 NOTE — Telephone Encounter (Signed)
Left message to call back  

## 2020-03-22 DIAGNOSIS — S7011XA Contusion of right thigh, initial encounter: Secondary | ICD-10-CM | POA: Diagnosis not present

## 2020-03-30 ENCOUNTER — Ambulatory Visit (INDEPENDENT_AMBULATORY_CARE_PROVIDER_SITE_OTHER)
Admission: RE | Admit: 2020-03-30 | Discharge: 2020-03-30 | Disposition: A | Discharge status: Home / Self Care | Payer: Self-pay | Source: Ambulatory Visit | Attending: Interventional Cardiology | Admitting: Interventional Cardiology

## 2020-03-30 ENCOUNTER — Other Ambulatory Visit: Payer: Self-pay

## 2020-03-30 ENCOUNTER — Encounter (INDEPENDENT_AMBULATORY_CARE_PROVIDER_SITE_OTHER): Payer: Self-pay

## 2020-03-30 DIAGNOSIS — E785 Hyperlipidemia, unspecified: Secondary | ICD-10-CM

## 2020-03-31 ENCOUNTER — Telehealth: Payer: Self-pay | Admitting: Interventional Cardiology

## 2020-03-31 DIAGNOSIS — E785 Hyperlipidemia, unspecified: Secondary | ICD-10-CM

## 2020-03-31 DIAGNOSIS — M65332 Trigger finger, left middle finger: Secondary | ICD-10-CM | POA: Diagnosis not present

## 2020-03-31 DIAGNOSIS — M25512 Pain in left shoulder: Secondary | ICD-10-CM | POA: Diagnosis not present

## 2020-03-31 DIAGNOSIS — M79642 Pain in left hand: Secondary | ICD-10-CM | POA: Diagnosis not present

## 2020-03-31 MED ORDER — ROSUVASTATIN CALCIUM 20 MG PO TABS: 20.0000 mg | ORAL_TABLET | Freq: Every day | ORAL | 3 refills | Status: DC

## 2020-03-31 NOTE — Telephone Encounter (Signed)
Patient stated that nurse said that the Dr. Tamala Julian would be putting her on new medication. Patient called with some concern of the medication and wanted to know if she could take Rosuvastatin because her husband is taking that and he states it work well for him. Please advise

## 2020-03-31 NOTE — Telephone Encounter (Signed)
-----   Message from Belva Crome, MD sent at 03/30/2020  6:15 PM EDT ----- Let the patient know the coronary Calc score is 1.7. Therefore, there is plaque and is in the largest heart artery. Start therapy with Rosuvastatin 20 mg daily. Liver and lipid in 6 weeks. A copy will be sent to Carol Ada, MD

## 2020-03-31 NOTE — Telephone Encounter (Signed)
Patient returning call for CT results. 

## 2020-03-31 NOTE — Telephone Encounter (Signed)
Returned call to pt.  She has been made aware that Rosuvastatin is the medication that Dr. Tamala Julian is putting her on.  She was wanting to know if she could try the 10 mg 1st since her husband is on that and I made her aware that her Calcium score was high, and she had plaque in the bigger artery of her heart, and Dr. Tamala Julian was going to start aggressively with the medication.  She was agreeable and was thankful for the call back.

## 2020-04-25 ENCOUNTER — Other Ambulatory Visit: Payer: Self-pay

## 2020-04-25 ENCOUNTER — Ambulatory Visit
Admission: RE | Admit: 2020-04-25 | Discharge: 2020-04-25 | Disposition: A | Discharge status: Home / Self Care | Payer: PPO | Source: Ambulatory Visit | Attending: Family Medicine | Admitting: Family Medicine

## 2020-04-25 DIAGNOSIS — Z1231 Encounter for screening mammogram for malignant neoplasm of breast: Secondary | ICD-10-CM | POA: Diagnosis not present

## 2020-05-03 ENCOUNTER — Telehealth: Payer: Self-pay | Admitting: Interventional Cardiology

## 2020-05-03 NOTE — Telephone Encounter (Signed)
Spoke with pt and advised receiving the Covid booster vaccine should not interfere with her having lipid panel drawn.  Pt verbalized understanding and thanked Therapist, sports for the call.

## 2020-05-03 NOTE — Telephone Encounter (Signed)
[name] is calling stating she was wanting to get her second booster next week and wanted to confirm that it would not interfere with her Lipid panel scheduled for next Friday. Please advise.

## 2020-05-09 ENCOUNTER — Telehealth: Payer: Self-pay | Admitting: Interventional Cardiology

## 2020-05-09 NOTE — Telephone Encounter (Addendum)
Patient may hold rosuvastatin for up to 2 weeks to see if symptoms resolve. If they do not resolve, she should resume taking and see her PCP. If they do resolve, then she should then resume at 10mg  daily. If she plans to stop medication temporarily, then her labs for Friday should be rescheduled.

## 2020-05-09 NOTE — Telephone Encounter (Signed)
Spoke with pt and advised pf RPH recommendation.  Pt states she will continue on the 20mg  dose of Rosuvastatin until after she receives lab results and then may consider reducing  Rosuvastatin to 10mg  (1/2) tablet daily.  Pt thanked Therapist, sports for the callback.

## 2020-05-09 NOTE — Telephone Encounter (Signed)
Symptoms started a week and a half ago. Stiff neck, headache, and foggy feeling (feels like she zones out). Patient was started on Crestor 03/31/20. Advised patient that Dr. Tamala Julian and his nurse are out of the office today. Upon return they will contact her. In the meantime will check with pharmacy for any advisement from them as well.

## 2020-05-09 NOTE — Telephone Encounter (Signed)
Pt c/o medication issue:  1. Name of Medication: Patient called and stated that rosuvastatin (CRESTOR) 20 MG tablet is causing her to have a stiff neck, headache, foggy feeling  2. How are you currently taking this medication (dosage and times per day)?  As written  3. Are you having a reaction (difficulty breathing--STAT)?  Yes   4. What is your medication issue?   a stiff neck, headache, foggy feeling

## 2020-05-12 ENCOUNTER — Other Ambulatory Visit: Payer: PPO | Admitting: *Deleted

## 2020-05-12 ENCOUNTER — Other Ambulatory Visit: Payer: Self-pay

## 2020-05-12 DIAGNOSIS — M79642 Pain in left hand: Secondary | ICD-10-CM | POA: Diagnosis not present

## 2020-05-12 DIAGNOSIS — E785 Hyperlipidemia, unspecified: Secondary | ICD-10-CM | POA: Diagnosis not present

## 2020-05-12 DIAGNOSIS — M65332 Trigger finger, left middle finger: Secondary | ICD-10-CM | POA: Diagnosis not present

## 2020-05-12 DIAGNOSIS — M25512 Pain in left shoulder: Secondary | ICD-10-CM | POA: Diagnosis not present

## 2020-05-12 LAB — LIPID PANEL
Chol/HDL Ratio: 2.3 ratio (ref 0.0–4.4)
Cholesterol, Total: 137 mg/dL (ref 100–199)
HDL: 60 mg/dL (ref >39)
LDL Chol Calc (NIH): 58 mg/dL (ref 0–99)
Triglycerides: 101 mg/dL (ref 0–149)
VLDL Cholesterol Cal: 19 mg/dL (ref 5–40)

## 2020-05-12 LAB — HEPATIC FUNCTION PANEL
ALT: 23 IU/L (ref 0–32)
AST: 17 IU/L (ref 0–40)
Albumin: 4.4 g/dL (ref 3.8–4.8)
Alkaline Phosphatase: 92 IU/L (ref 44–121)
Bilirubin Total: 0.6 mg/dL (ref 0.0–1.2)
Bilirubin, Direct: 0.19 mg/dL (ref 0.00–0.40)
Total Protein: 7.1 g/dL (ref 6.0–8.5)

## 2020-07-12 ENCOUNTER — Telehealth: Payer: Self-pay | Admitting: Interventional Cardiology

## 2020-07-12 DIAGNOSIS — Z79899 Other long term (current) drug therapy: Secondary | ICD-10-CM

## 2020-07-12 DIAGNOSIS — E785 Hyperlipidemia, unspecified: Secondary | ICD-10-CM

## 2020-07-12 NOTE — Telephone Encounter (Signed)
Patient called with a few questions.  Does she need to have labs drawn again before her appt in November?  Is it safe for her to do water aerobics?  She has gained weight during COVID and can not do many exercises due to her  arthritis Can she take a supplement like the [place] gummies to increase her metabolism? She is not sure what she can take.  Please advise

## 2020-07-12 NOTE — Telephone Encounter (Signed)
Pt scheduled for fasting blood work on 11/4 (prior to 11/7 OV w/ Smith) Pt aware ok to do water aerobic and ok to take the gummies if she desires. Patient verbalized understanding and agreeable to plan.

## 2020-07-12 NOTE — Addendum Note (Signed)
Addended by: Stanton Kidney on: 07/12/2020 12:44 PM   Modules accepted: Orders

## 2020-07-26 DIAGNOSIS — M25562 Pain in left knee: Secondary | ICD-10-CM | POA: Diagnosis not present

## 2020-08-07 DIAGNOSIS — M25562 Pain in left knee: Secondary | ICD-10-CM | POA: Diagnosis not present

## 2020-08-07 DIAGNOSIS — M1712 Unilateral primary osteoarthritis, left knee: Secondary | ICD-10-CM | POA: Diagnosis not present

## 2020-08-14 DIAGNOSIS — R898 Other abnormal findings in specimens from other organs, systems and tissues: Secondary | ICD-10-CM | POA: Diagnosis not present

## 2020-08-14 DIAGNOSIS — N941 Unspecified dyspareunia: Secondary | ICD-10-CM | POA: Diagnosis not present

## 2020-08-14 DIAGNOSIS — M8580 Other specified disorders of bone density and structure, unspecified site: Secondary | ICD-10-CM | POA: Diagnosis not present

## 2020-08-14 DIAGNOSIS — Z01419 Encounter for gynecological examination (general) (routine) without abnormal findings: Secondary | ICD-10-CM | POA: Diagnosis not present

## 2020-09-08 ENCOUNTER — Other Ambulatory Visit: Payer: Self-pay | Admitting: Nurse Practitioner

## 2020-09-08 DIAGNOSIS — Z803 Family history of malignant neoplasm of breast: Secondary | ICD-10-CM

## 2020-09-08 DIAGNOSIS — R898 Other abnormal findings in specimens from other organs, systems and tissues: Secondary | ICD-10-CM

## 2020-09-30 DIAGNOSIS — R0602 Shortness of breath: Secondary | ICD-10-CM | POA: Diagnosis not present

## 2020-09-30 DIAGNOSIS — R0789 Other chest pain: Secondary | ICD-10-CM | POA: Diagnosis not present

## 2020-10-10 DIAGNOSIS — J069 Acute upper respiratory infection, unspecified: Secondary | ICD-10-CM | POA: Diagnosis not present

## 2020-10-10 DIAGNOSIS — R053 Chronic cough: Secondary | ICD-10-CM | POA: Diagnosis not present

## 2020-10-20 DIAGNOSIS — M65332 Trigger finger, left middle finger: Secondary | ICD-10-CM | POA: Diagnosis not present

## 2020-10-20 DIAGNOSIS — M79642 Pain in left hand: Secondary | ICD-10-CM | POA: Diagnosis not present

## 2020-11-06 DIAGNOSIS — M65332 Trigger finger, left middle finger: Secondary | ICD-10-CM | POA: Diagnosis not present

## 2020-11-06 NOTE — Progress Notes (Signed)
Cardiology Office Note:    Date:  11/13/2020   ID:  [name], DOB [birth year], MRN [id number]  PCP:  Carol Ada, MD  Cardiologist:  Sinclair Grooms, MD   Referring MD: Carol Ada, MD   Chief Complaint  Patient presents with   Follow-up    CAD PSVT Hyperlipidemia    History of Present Illness:    [name] is a 65 y.o. female with a hx of  PSVT controlled on beta blocker therapy, hyperlipidemia, and abnormal CAC Score 1.7.  She is doing well.  She has a lot of questions about current preventive therapy.  She has no symptoms.  Her calcium score  Past Medical History:  Diagnosis Date   Arthritis    Complication of anesthesia    sensitive to meds   Dysrhythmia    hx psvt   Genetic testing 07/26/2016   [name] underwent genetic counseling and testing for hereditary cancer syndromes on 07/11/2016. 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   The remaining 45 genes analyzed by [facility]'s 46-gene Common Hereditary Cancers Panel were negative for mutations. Genes analyzed include: APC, ATM, AXIN2, BARD1, BMPR1A, BRCA1, BRCA2, BRIP1, CDH1, CDKN2A, CHE   HOH (hard of hearing)    IBS (irritable bowel syndrome)    Lipoma    Left arm   Monoallelic mutation of CHEK2 gene in female patient 07/26/2016   [name] underwent genetic counseling and testing for hereditary cancer syndromes on 07/11/2016. 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   The remaining 45 genes analyzed by [facility]'s 46-gene Common Hereditary Cancers Panel were negative for mutations. Genes analyzed include: APC, ATM, AXIN2, BARD1, BMPR1A, BRCA1, BRCA2, BRIP1, CDH1, CDKN2A, CHE   Seasonal allergies    Tachycardia    PVTS    Past Surgical History:  Procedure Laterality Date   BACK SURGERY  2009   BACK SURGERY     CARPAL TUNNEL RELEASE  2000   bilateral   LIPOMA EXCISION  05/24/2011    Procedure: EXCISION LIPOMA;  Surgeon: Imogene Burn. Georgette Dover, MD;  Location: [facility];  Service: General;  Laterality: Left;  excision of left arm lipoma   TONSILLECTOMY      Current Medications: Current Meds  Medication Sig   atenolol (TENORMIN) 25 MG tablet TAKE 1 TABLET BY MOUTH DAILY. PLEASE KEEP UPCOMING APPT   Clobetasol Prop Emollient Base 0.05 % emollient cream Apply one application to genital area once a week as needed for moisture.     Allergies:   Patient has no known allergies.   Social History   Socioeconomic History   Marital status: Married    Spouse name: Not on file   Number of children: Not on file   Years of education: Not on file   Highest education level: Not on file  Occupational History   Not on file  Tobacco Use   Smoking status: Former    Types: Cigarettes    Quit date: 01/08/1987    Years since quitting: 33.8   Smokeless tobacco: Never  Substance and Sexual Activity   Alcohol use: No    Alcohol/week: 0.0 standard drinks   Drug use: No   Sexual activity: Not on file  Other Topics Concern   Not on file  Social History Narrative   Not on file   Social Determinants of Health   Financial Resource Strain: Not on file  Food Insecurity: Not on file  Transportation Needs: Not on file  Physical Activity: Not on file  Stress: Not on file  Social Connections: Not on file     Family History: The patient's family history includes Breast cancer (age of onset: 30) in her sister; Breast cancer (age of onset: 70) in her maternal aunt; COPD in her mother; Colon cancer in her cousin; Heart disease in her father and mother; Other in her sister; Stomach cancer (age of onset: 33) in her maternal aunt.  ROS:   Please see the history of present illness.    Has concerns about being on medications.  We had a conversation concerning risk versus benefit.  She has decided to maintain current course since the calcium is located in the left anterior  descending.  All other systems reviewed and are negative.  EKGs/Labs/Other Studies Reviewed:    The following studies were reviewed today: Recent laboratory data demonstrated LDL of 52 with normal liver tests. Coronary calcium score: FINDINGS: Coronary arteries: Normal origins.   Coronary Calcium Score:   Left main: 0   Left anterior descending artery: 1.71   Left circumflex artery: 0   Right coronary artery: 0   Total: 1.71   Percentile: 55   Pericardium: Normal.   Ascending Aorta: Normal caliber.   Non-cardiac: See separate report from [facility].   IMPRESSION: Coronary calcium score of 1.71. This was 52 percentile for age-, race-, and sex-matched controls.   EKG:  EKG normal sinus rhythm, PR interval 230 ms.  Vertical axis, left atrial abnormality.  Recent Labs: 11/10/2020: ALT 20  Recent Lipid Panel    Component Value Date/Time   CHOL 129 11/10/2020 0955   TRIG 72 11/10/2020 0955   HDL 62 11/10/2020 0955   CHOLHDL 2.1 11/10/2020 0955   LDLCALC 52 11/10/2020 0955    Physical Exam:    VS:  BP 128/80   Pulse 66   Ht _0  (1.575 m)   Wt 172 lb 6.4 oz (78.2 kg)   SpO2 100%   BMI 31.53 kg/m     Wt Readings from Last 3 Encounters:  11/13/20 172 lb 6.4 oz (78.2 kg)  10/08/19 175 lb 4.3 oz (79.5 kg)  10/04/19 175 lb 3.2 oz (79.5 kg)     GEN: Slightly overweight. No acute distress HEENT: Normal NECK: No JVD. LYMPHATICS: No lymphadenopathy CARDIAC: No murmur. RRR no gallop, or edema. VASCULAR:  Normal Pulses. No bruits. RESPIRATORY:  Clear to auscultation without rales, wheezing or rhonchi  ABDOMEN: Soft, non-tender, non-distended, No pulsatile mass, MUSCULOSKELETAL: No deformity  SKIN: Warm and dry NEUROLOGIC:  Alert and oriented x 3 PSYCHIATRIC:  Normal affect   ASSESSMENT:    1. PSVT (paroxysmal supraventricular tachycardia) ([place])   2. Hyperlipidemia LDL goal <100   3. Agatston CAC score, <100    PLAN:    In order of problems  listed above:  Controlled LDL target less than 70.  Current levels 54 on high intensity statin therapy.  Continue same. Asymptomatic relative to angina.   Medication Adjustments/Labs and Tests Ordered: Current medicines are reviewed at length with the patient today.  Concerns regarding medicines are outlined above.  Orders Placed This Encounter  Procedures   EKG 12-Lead   No orders of the defined types were placed in this encounter.   There are no Patient Instructions on file for this visit.   Signed, Sinclair Grooms, MD  11/13/2020 8:45 AM    [place]

## 2020-11-07 ENCOUNTER — Telehealth: Payer: Self-pay | Admitting: Interventional Cardiology

## 2020-11-07 NOTE — Telephone Encounter (Signed)
New Message:      Patient says she is scheduled for Lab work on Friday(11-10-20) to check her Lipids and Liver. Her concern is, she have been taking Tylenol and Ibuprofen  every  3 hrs for the last 2 days and will take it for 2 more days. She is taking this because, she had hand surgery yesterday. She wants to know should she still have the lab work? These two medicine is supposed to effect  your liver and kidney.  Pt c/o medication issue:  1. Name of Medication: Tylenol and Ibuprofen  2. How are you currently taking this medication (dosage and times per day)? Every  3 hours  3. Are you having a reaction (difficulty breathing--STAT)? no  4. What is your medication issue? Will this effect her lab work with her liver and lipids

## 2020-11-07 NOTE — Telephone Encounter (Signed)
Spoke with pt and made her aware that it is ok to have labs drawn on Friday.  Advised we would just keep this information in mind should her liver or kidney numbers be elevated.  Pt appreciative for call.

## 2020-11-10 ENCOUNTER — Other Ambulatory Visit: Payer: Self-pay

## 2020-11-10 ENCOUNTER — Other Ambulatory Visit: Payer: PPO | Admitting: *Deleted

## 2020-11-10 DIAGNOSIS — Z79899 Other long term (current) drug therapy: Secondary | ICD-10-CM | POA: Diagnosis not present

## 2020-11-10 DIAGNOSIS — M25642 Stiffness of left hand, not elsewhere classified: Secondary | ICD-10-CM | POA: Diagnosis not present

## 2020-11-10 DIAGNOSIS — E785 Hyperlipidemia, unspecified: Secondary | ICD-10-CM | POA: Diagnosis not present

## 2020-11-10 LAB — LIPID PANEL
Chol/HDL Ratio: 2.1 ratio (ref 0.0–4.4)
Cholesterol, Total: 129 mg/dL (ref 100–199)
HDL: 62 mg/dL (ref >39)
LDL Chol Calc (NIH): 52 mg/dL (ref 0–99)
Triglycerides: 72 mg/dL (ref 0–149)
VLDL Cholesterol Cal: 15 mg/dL (ref 5–40)

## 2020-11-10 LAB — HEPATIC FUNCTION PANEL
ALT: 20 IU/L (ref 0–32)
AST: 16 IU/L (ref 0–40)
Albumin: 4.2 g/dL (ref 3.8–4.8)
Alkaline Phosphatase: 93 IU/L (ref 44–121)
Bilirubin Total: 0.5 mg/dL (ref 0.0–1.2)
Bilirubin, Direct: 0.19 mg/dL (ref 0.00–0.40)
Total Protein: 6.8 g/dL (ref 6.0–8.5)

## 2020-11-13 ENCOUNTER — Ambulatory Visit: Payer: PPO | Admitting: Interventional Cardiology

## 2020-11-13 ENCOUNTER — Other Ambulatory Visit: Payer: Self-pay

## 2020-11-13 ENCOUNTER — Encounter: Payer: Self-pay | Admitting: Interventional Cardiology

## 2020-11-13 VITALS — BP 128/80 | HR 66 | Ht 62.0 in | Wt 172.4 lb

## 2020-11-13 DIAGNOSIS — R931 Abnormal findings on diagnostic imaging of heart and coronary circulation: Secondary | ICD-10-CM

## 2020-11-13 DIAGNOSIS — E785 Hyperlipidemia, unspecified: Secondary | ICD-10-CM | POA: Diagnosis not present

## 2020-11-13 DIAGNOSIS — I471 Supraventricular tachycardia: Secondary | ICD-10-CM

## 2020-11-13 NOTE — Patient Instructions (Signed)

## 2020-11-15 DIAGNOSIS — M25642 Stiffness of left hand, not elsewhere classified: Secondary | ICD-10-CM | POA: Diagnosis not present

## 2020-11-24 DIAGNOSIS — M25642 Stiffness of left hand, not elsewhere classified: Secondary | ICD-10-CM | POA: Diagnosis not present

## 2020-11-27 DIAGNOSIS — Z961 Presence of intraocular lens: Secondary | ICD-10-CM | POA: Diagnosis not present

## 2020-11-27 DIAGNOSIS — H26491 Other secondary cataract, right eye: Secondary | ICD-10-CM | POA: Diagnosis not present

## 2020-11-27 DIAGNOSIS — H524 Presbyopia: Secondary | ICD-10-CM | POA: Diagnosis not present

## 2020-11-27 DIAGNOSIS — H40013 Open angle with borderline findings, low risk, bilateral: Secondary | ICD-10-CM | POA: Diagnosis not present

## 2020-11-29 DIAGNOSIS — M25642 Stiffness of left hand, not elsewhere classified: Secondary | ICD-10-CM | POA: Diagnosis not present

## 2020-12-10 ENCOUNTER — Other Ambulatory Visit: Payer: Self-pay | Admitting: Interventional Cardiology

## 2020-12-14 DIAGNOSIS — M25642 Stiffness of left hand, not elsewhere classified: Secondary | ICD-10-CM | POA: Diagnosis not present

## 2020-12-21 ENCOUNTER — Telehealth: Payer: Self-pay | Admitting: Interventional Cardiology

## 2020-12-21 NOTE — Telephone Encounter (Signed)
Pt called to advise that she have been experiencing Low Energy. She wants to know if it is safe for her to take a B 12 tablet to boost her energy, and if it is okay, she wants to know how many days per week she can take the tablet and what dosage? Pt said her sister takes a B 12 tablet and it works great for her.  Please advise pt further [phone]

## 2020-12-21 NOTE — Telephone Encounter (Signed)
Spoke with Lenna Sciara, [place] to make sure there is no contraindication for B12 use.  She said there wasn't any.  Spoke with pt and made her aware.  Advised her to reach out to PCP about proper dosing.  Also advised that PCP may want to draw a series of labs to make sure levels are actually low and that the low energy isn't caused by something else (I.e. thyroid issues, other vitamin deficiencies, etc) before prescribing.  Pt verbalized understanding and was appreciative for information.

## 2021-02-12 DIAGNOSIS — L821 Other seborrheic keratosis: Secondary | ICD-10-CM | POA: Diagnosis not present

## 2021-02-12 DIAGNOSIS — Z23 Encounter for immunization: Secondary | ICD-10-CM | POA: Diagnosis not present

## 2021-02-12 DIAGNOSIS — D1722 Benign lipomatous neoplasm of skin and subcutaneous tissue of left arm: Secondary | ICD-10-CM | POA: Diagnosis not present

## 2021-02-12 DIAGNOSIS — L578 Other skin changes due to chronic exposure to nonionizing radiation: Secondary | ICD-10-CM | POA: Diagnosis not present

## 2021-02-12 DIAGNOSIS — L814 Other melanin hyperpigmentation: Secondary | ICD-10-CM | POA: Diagnosis not present

## 2021-02-12 DIAGNOSIS — D225 Melanocytic nevi of trunk: Secondary | ICD-10-CM | POA: Diagnosis not present

## 2021-02-12 DIAGNOSIS — L905 Scar conditions and fibrosis of skin: Secondary | ICD-10-CM | POA: Diagnosis not present

## 2021-02-19 ENCOUNTER — Other Ambulatory Visit: Payer: Self-pay | Admitting: Interventional Cardiology

## 2021-02-21 DIAGNOSIS — M5136 Other intervertebral disc degeneration, lumbar region: Secondary | ICD-10-CM | POA: Diagnosis not present

## 2021-02-21 DIAGNOSIS — M5451 Vertebrogenic low back pain: Secondary | ICD-10-CM | POA: Diagnosis not present

## 2021-02-22 DIAGNOSIS — M940 Chondrocostal junction syndrome [Tietze]: Secondary | ICD-10-CM | POA: Diagnosis not present

## 2021-02-22 DIAGNOSIS — R079 Chest pain, unspecified: Secondary | ICD-10-CM | POA: Diagnosis not present

## 2021-04-05 DIAGNOSIS — M26602 Left temporomandibular joint disorder, unspecified: Secondary | ICD-10-CM | POA: Diagnosis not present

## 2021-04-06 ENCOUNTER — Other Ambulatory Visit: Payer: Self-pay | Admitting: Interventional Cardiology

## 2021-04-06 NOTE — Telephone Encounter (Signed)
[name] pharmacy is stating that pt needs a prior auth done. Jeani Hawking, LPN, can you please advise on this matter? Thanks  ?

## 2021-04-09 NOTE — Telephone Encounter (Signed)
**Note De-Identified [name] Obfuscation** Per [facility] pharmacist there is no PA required for the pts Rosuvastatin and that she has already picked up her refill at a $0 co-pay cost to her. ? ?

## 2021-04-26 DIAGNOSIS — M8580 Other specified disorders of bone density and structure, unspecified site: Secondary | ICD-10-CM | POA: Diagnosis not present

## 2021-05-31 ENCOUNTER — Other Ambulatory Visit: Payer: Self-pay | Admitting: Family Medicine

## 2021-05-31 ENCOUNTER — Ambulatory Visit
Admission: RE | Admit: 2021-05-31 | Discharge: 2021-05-31 | Disposition: A | Discharge status: Home / Self Care | Payer: PPO | Source: Ambulatory Visit | Attending: Family Medicine | Admitting: Family Medicine

## 2021-05-31 DIAGNOSIS — Z1231 Encounter for screening mammogram for malignant neoplasm of breast: Secondary | ICD-10-CM | POA: Diagnosis not present

## 2021-06-24 IMAGING — CT CT ABD-PELV W/ CM
2 of 5 series · 17 of 46 positions shown, 19 images · IV contrast (Omnipaque)
Comparison: None.

CLINICAL DATA: Lower abdominal pain

EXAM:
CT ABDOMEN AND PELVIS WITH CONTRAST
TECHNIQUE: Multidetector CT imaging of the abdomen and pelvis was performed
using the standard protocol following bolus administration of
intravenous contrast.
CONTRAST:  100mL OMNIPAQUE IOHEXOL 300 MG/ML  SOLN

[Series 2: axial st · axial · 0.75mm/px · z∈[-335,+50]mm · 14 of 87 slices shown, 16 images]
[im 5/87  soft-tissue]
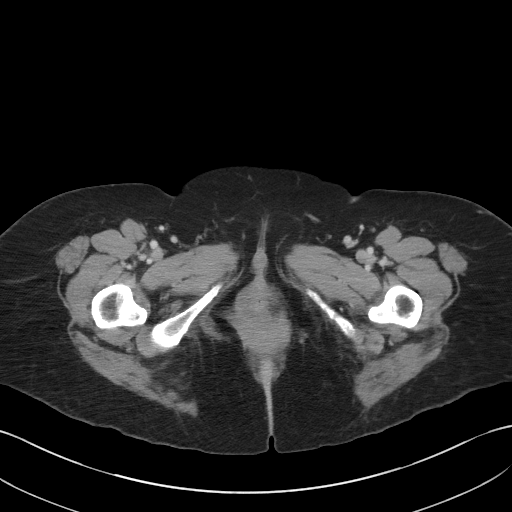
[im 5/87  bone]
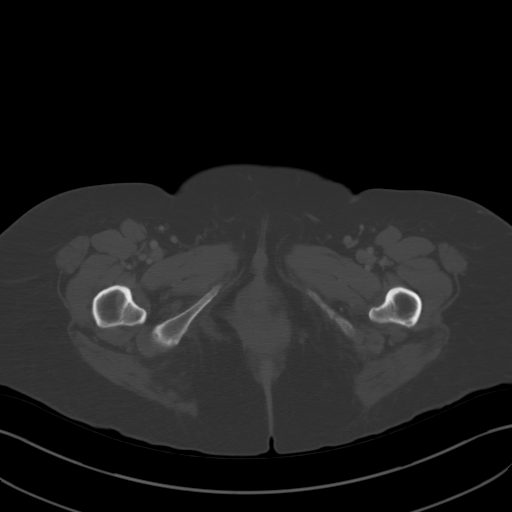
[im 10/87  soft-tissue]
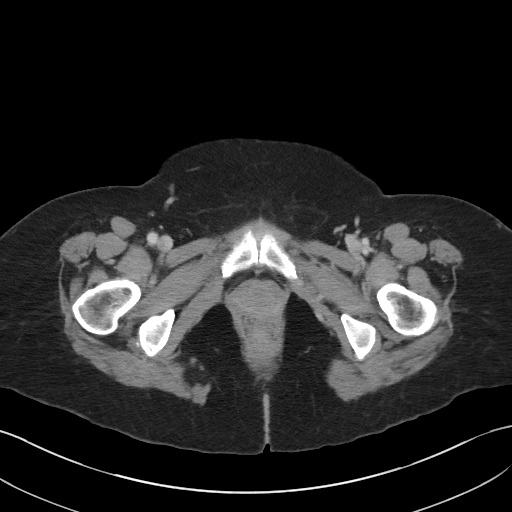
[im 20/87  soft-tissue]
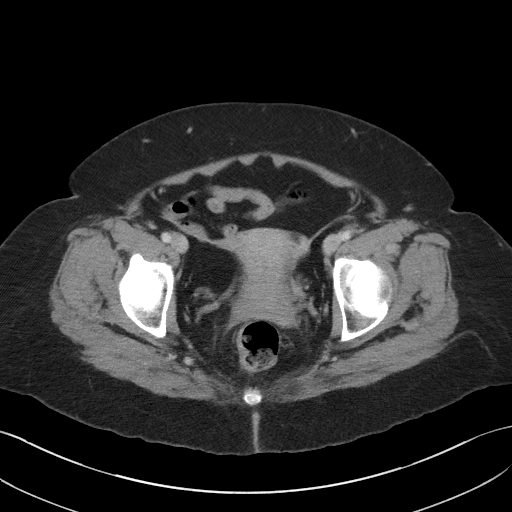
[im 24/87  soft-tissue]
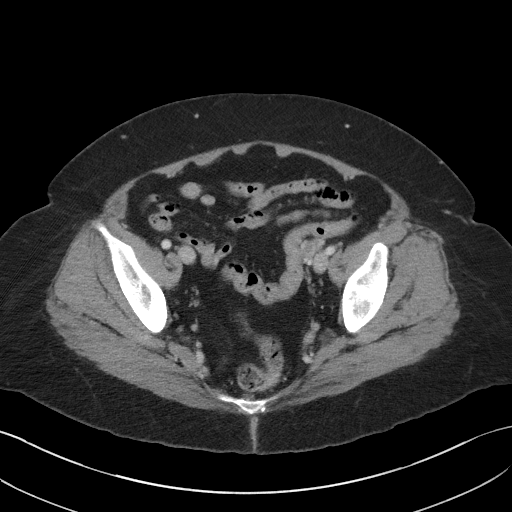
[im 29/87  soft-tissue]
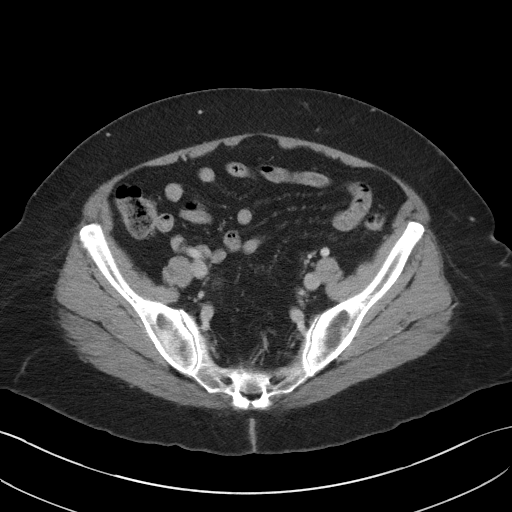
[im 34/87  soft-tissue]
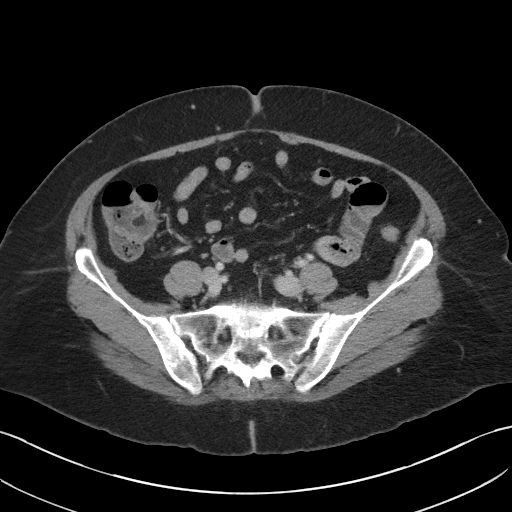
[im 39/87  soft-tissue]
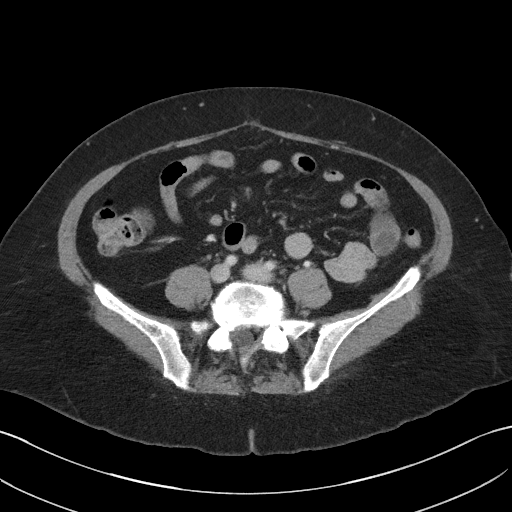
[im 48/87  soft-tissue]
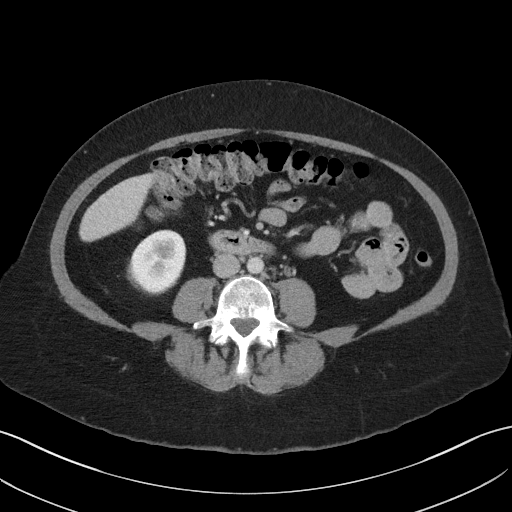
[im 53/87  soft-tissue]
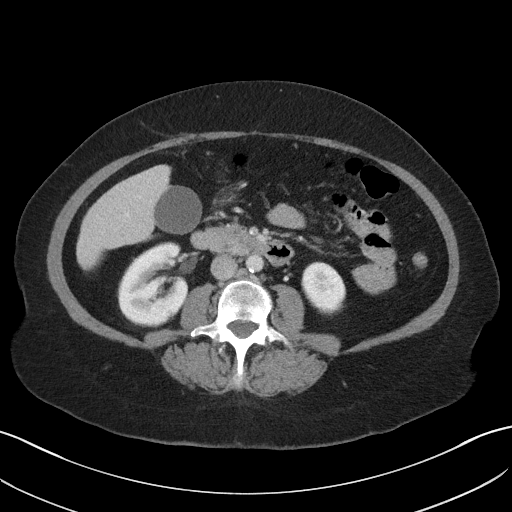
[im 53/87  bone]
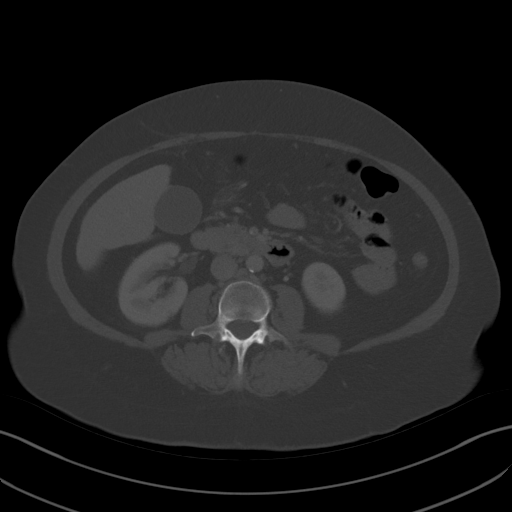
[im 58/87  soft-tissue]
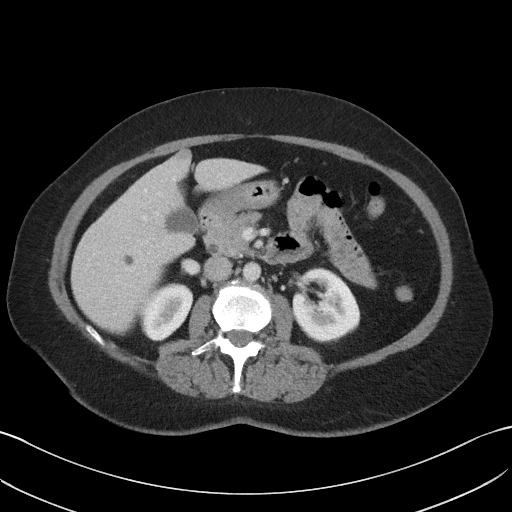
[im 63/87  soft-tissue]
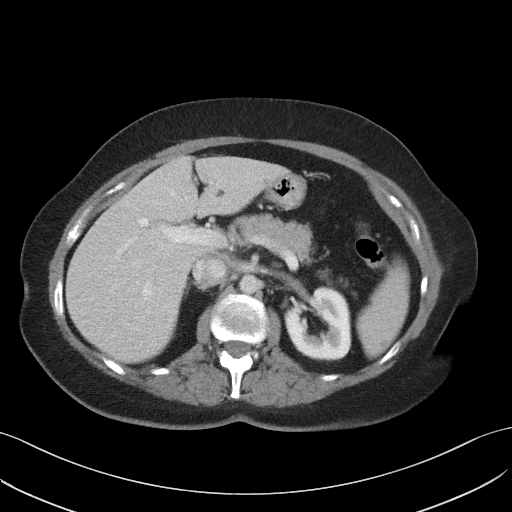
[im 67/87  soft-tissue]
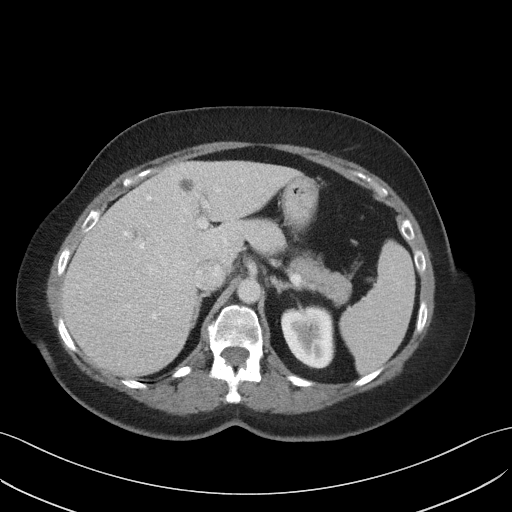
[im 77/87  soft-tissue]
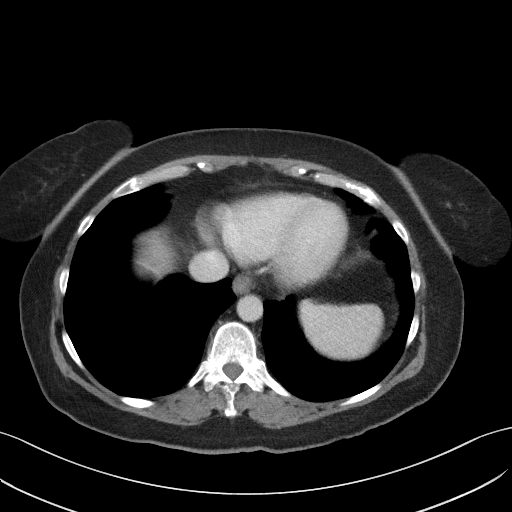
[im 82/87  soft-tissue]
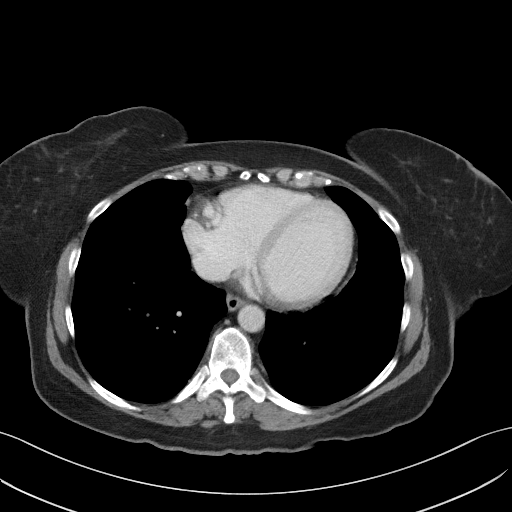

[Series 5: coronal st · coronal · 0.83mm/px · 3 of 86 slices shown]
[im 29/86  soft-tissue]
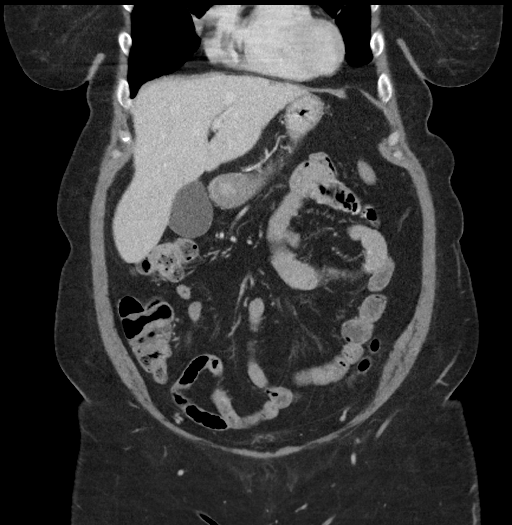
[im 38/86  soft-tissue]
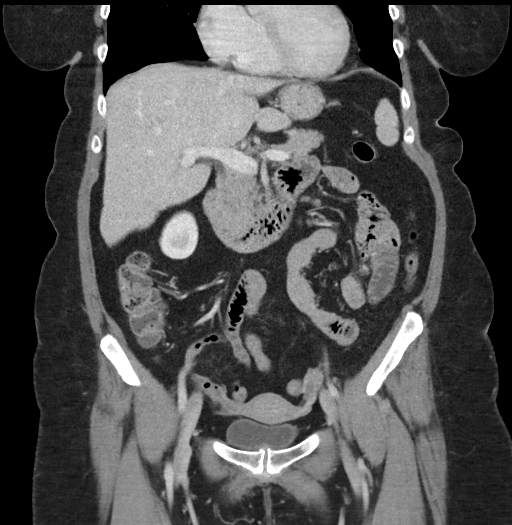
[im 48/86  soft-tissue]
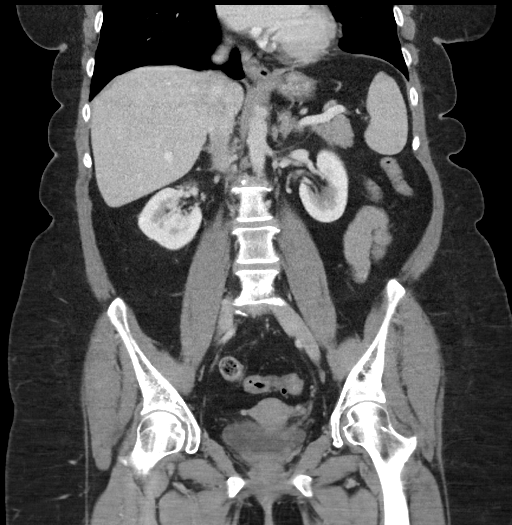

[17 of 46 positions shown; findings below may reference images not displayed]

FINDINGS: Lower chest: The visualized heart size within normal limits. No
pericardial fluid/thickening.

No hiatal hernia.

The visualized portions of the lungs are clear.

Hepatobiliary: Multiple low-density lesions are seen throughout the
liver parenchyma the largest in the posterior right liver lobe
measuring 3.9 cm, likely hepatic cyst.The main portal vein is
patent. No evidence of calcified gallstones, gallbladder wall
thickening or biliary dilatation.

Pancreas: Unremarkable. No pancreatic ductal dilatation or
surrounding inflammatory changes.

Spleen: Normal in size without focal abnormality.

Adrenals/Urinary Tract: Both adrenal glands appear normal. The
kidneys and collecting system appear normal without evidence of
urinary tract calculus or hydronephrosis. Bladder is unremarkable.

Stomach/Bowel: The stomach, small bowel, and colon are normal in
appearance. No inflammatory changes, wall thickening, or obstructive
findings.The appendix is normal.

Vascular/Lymphatic: There are no enlarged mesenteric,
retroperitoneal, or pelvic lymph nodes. Scattered minimal aortic
atherosclerosis seen within the distal abdominal aorta.

Reproductive: The uterus and adnexa are unremarkable.

Other: No evidence of abdominal wall mass or hernia.

Musculoskeletal: No acute or significant osseous findings.
IMPRESSION: No acute intra-abdominal or pelvic pathology to explain the
patient's symptoms.

## 2021-07-12 DIAGNOSIS — I872 Venous insufficiency (chronic) (peripheral): Secondary | ICD-10-CM | POA: Diagnosis not present

## 2021-07-18 DIAGNOSIS — I872 Venous insufficiency (chronic) (peripheral): Secondary | ICD-10-CM | POA: Diagnosis not present

## 2021-07-18 DIAGNOSIS — I83893 Varicose veins of bilateral lower extremities with other complications: Secondary | ICD-10-CM | POA: Diagnosis not present

## 2021-08-15 ENCOUNTER — Other Ambulatory Visit: Payer: Self-pay

## 2021-08-15 ENCOUNTER — Other Ambulatory Visit (HOSPITAL_COMMUNITY)
Admission: RE | Admit: 2021-08-15 | Discharge: 2021-08-15 | Disposition: A | Discharge status: Home / Self Care | Payer: PPO | Source: Ambulatory Visit | Attending: Nurse Practitioner | Admitting: Nurse Practitioner

## 2021-08-15 DIAGNOSIS — N9089 Other specified noninflammatory disorders of vulva and perineum: Secondary | ICD-10-CM | POA: Diagnosis not present

## 2021-08-15 DIAGNOSIS — Z1211 Encounter for screening for malignant neoplasm of colon: Secondary | ICD-10-CM | POA: Diagnosis not present

## 2021-08-15 DIAGNOSIS — L292 Pruritus vulvae: Secondary | ICD-10-CM | POA: Diagnosis not present

## 2021-08-15 DIAGNOSIS — R898 Other abnormal findings in specimens from other organs, systems and tissues: Secondary | ICD-10-CM | POA: Diagnosis not present

## 2021-08-15 DIAGNOSIS — Z124 Encounter for screening for malignant neoplasm of cervix: Secondary | ICD-10-CM | POA: Insufficient documentation

## 2021-08-15 DIAGNOSIS — R87610 Atypical squamous cells of undetermined significance on cytologic smear of cervix (ASC-US): Secondary | ICD-10-CM | POA: Insufficient documentation

## 2021-08-15 DIAGNOSIS — Z9189 Other specified personal risk factors, not elsewhere classified: Secondary | ICD-10-CM | POA: Diagnosis not present

## 2021-08-15 DIAGNOSIS — M85852 Other specified disorders of bone density and structure, left thigh: Secondary | ICD-10-CM | POA: Diagnosis not present

## 2021-08-15 DIAGNOSIS — L814 Other melanin hyperpigmentation: Secondary | ICD-10-CM | POA: Diagnosis not present

## 2021-08-16 ENCOUNTER — Other Ambulatory Visit: Payer: Self-pay | Admitting: Nurse Practitioner

## 2021-08-16 DIAGNOSIS — M85852 Other specified disorders of bone density and structure, left thigh: Secondary | ICD-10-CM

## 2021-08-20 DIAGNOSIS — Z9889 Other specified postprocedural states: Secondary | ICD-10-CM | POA: Diagnosis not present

## 2021-08-20 DIAGNOSIS — L814 Other melanin hyperpigmentation: Secondary | ICD-10-CM | POA: Diagnosis not present

## 2021-08-20 LAB — CYTOLOGY - PAP
Comment: NEGATIVE
Diagnosis: NEGATIVE
High risk HPV: NEGATIVE

## 2021-09-20 ENCOUNTER — Ambulatory Visit
Admission: RE | Admit: 2021-09-20 | Discharge: 2021-09-20 | Disposition: A | Discharge status: Home / Self Care | Payer: PPO | Source: Ambulatory Visit | Attending: Nurse Practitioner | Admitting: Nurse Practitioner

## 2021-09-20 DIAGNOSIS — M8589 Other specified disorders of bone density and structure, multiple sites: Secondary | ICD-10-CM | POA: Diagnosis not present

## 2021-09-20 DIAGNOSIS — Z780 Asymptomatic menopausal state: Secondary | ICD-10-CM | POA: Diagnosis not present

## 2021-09-20 DIAGNOSIS — M85852 Other specified disorders of bone density and structure, left thigh: Secondary | ICD-10-CM

## 2021-10-01 DIAGNOSIS — M109 Gout, unspecified: Secondary | ICD-10-CM | POA: Diagnosis not present

## 2021-10-03 DIAGNOSIS — M79671 Pain in right foot: Secondary | ICD-10-CM | POA: Diagnosis not present

## 2021-10-09 DIAGNOSIS — M79671 Pain in right foot: Secondary | ICD-10-CM | POA: Diagnosis not present

## 2021-10-25 DIAGNOSIS — Q6689 Other  specified congenital deformities of feet: Secondary | ICD-10-CM | POA: Diagnosis not present

## 2021-10-25 DIAGNOSIS — M76821 Posterior tibial tendinitis, right leg: Secondary | ICD-10-CM | POA: Diagnosis not present

## 2021-11-27 DIAGNOSIS — Q6689 Other  specified congenital deformities of feet: Secondary | ICD-10-CM | POA: Diagnosis not present

## 2021-11-27 DIAGNOSIS — M76821 Posterior tibial tendinitis, right leg: Secondary | ICD-10-CM | POA: Diagnosis not present

## 2021-11-27 DIAGNOSIS — M7671 Peroneal tendinitis, right leg: Secondary | ICD-10-CM | POA: Diagnosis not present

## 2021-12-03 DIAGNOSIS — Z20822 Contact with and (suspected) exposure to covid-19: Secondary | ICD-10-CM | POA: Diagnosis not present

## 2021-12-04 DIAGNOSIS — U071 COVID-19: Secondary | ICD-10-CM | POA: Diagnosis not present

## 2021-12-06 NOTE — Progress Notes (Deleted)
Cardiology Office Note:    Date:  12/06/2021   ID:  [name], DOB 1955/01/10, MRN [id number]  PCP:  Carol Ada, [facility] Cardiologist:  Sinclair Grooms, MD { Click to update primary MD,subspecialty MD or APP then REFRESH:1}    Referring MD: Carol Ada, MD   Chief Complaint:  No chief complaint on file. {Click here for Visit Info    :1}    History of Present Illness:   [name] is a 66 y.o. female with  history of SVT, coronary calcium score 1.7 03/2020, HLD.  Patient last saw Dr. Tamala Julian 11/2020 and doing well.      Past Medical History:  Diagnosis Date   Arthritis    Complication of anesthesia    sensitive to meds   Dysrhythmia    hx psvt   Genetic testing 07/26/2016   [name] underwent genetic counseling and testing for hereditary cancer syndromes on 07/11/2016. 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   The remaining 45 genes analyzed by [facility]'s 46-gene Common Hereditary Cancers Panel were negative for mutations. 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 underwent genetic counseling and testing for hereditary cancer syndromes on 07/11/2016. 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   The remaining 45 genes analyzed by [facility]'s 46-gene Common Hereditary Cancers Panel were negative for mutations. Genes analyzed include: APC, ATM, AXIN2, BARD1, BMPR1A, BRCA1, BRCA2, BRIP1, CDH1, CDKN2A, CHE   Seasonal allergies    Tachycardia    PVTS   Current Medications: No outpatient medications have been marked as taking for the 12/19/21 encounter (Appointment) with Julie Burn, PA-C.    Allergies:   Patient has no  known allergies.   Social History   Tobacco Use   Smoking status: Former    Types: Cigarettes    Quit date: 01/08/1987    Years since quitting: 34.9   Smokeless tobacco: Never  Substance Use Topics   Alcohol use: No    Alcohol/week: 0.0 standard drinks of alcohol   Drug use: No    Family Hx: The patient's family history includes Breast cancer (age of onset: 67) in her sister; Breast cancer (age of onset: 71) in her maternal aunt; COPD in her mother; Colon cancer in her cousin; Heart disease in her father and mother; Other in her sister; Stomach cancer (age of onset: 66) in her maternal aunt.  ROS     Physical Exam:    VS:  There were no vitals taken for this visit.    Wt Readings from Last 3 Encounters:  11/13/20 172 lb 6.4 oz (78.2 kg)  10/08/19 175 lb 4.3 oz (79.5 kg)  10/04/19 175 lb 3.2 oz (79.5 kg)    Physical Exam  GEN: Well nourished, well developed, in no acute distress  HEENT: normal  Neck: no JVD, carotid bruits, or masses Cardiac:RRR; no murmurs, rubs, or gallops  Respiratory:  clear to auscultation bilaterally, normal work of breathing GI: soft, nontender, nondistended, + BS Ext: without cyanosis, clubbing, or edema, Good distal pulses bilaterally MS: no deformity or atrophy  Skin: warm and dry, no rash Neuro:  Alert and Oriented x 3, Strength and  sensation are intact Psych: euthymic mood, full affect        EKGs/Labs/Other Test Reviewed:    EKG:  EKG is *** ordered today.  The ekg ordered today demonstrates ***  Recent Labs: No results found for requested labs within last 365 days.   Recent Lipid Panel No results for input(s): "CHOL", "TRIG", "HDL", "VLDL", "LDLCALC", "LDLDIRECT" in the last 8760 hours.   Prior CV Studies: {Select studies to display:26339}   Coronary calcium score 03/2020:  Coronary calcium score: FINDINGS: Coronary arteries: Normal origins.   Coronary Calcium Score:   Left main: 0   Left anterior descending artery:  1.71   Left circumflex artery: 0   Right coronary artery: 0   Total: 1.71   Percentile: 55   Pericardium: Normal.   Ascending Aorta: Normal caliber.   Non-cardiac: See separate report from [facility].   IMPRESSION: Coronary calcium score of 1.71. This was 77 percentile for age-, race-, and sex-matched controls.       Risk Assessment/Calculations/Metrics:   {Does this patient have ATRIAL FIBRILLATION?:[phone]}     No BP recorded.  {Refresh Note OR Click here to enter BP  :1}***    ASSESSMENT & PLAN:   No problem-specific Assessment & Plan notes found for this encounter.   PSVT on beta blocker  HLD on statin  Coronary calcium score 1.7  03/2020-risk factor modification.        {Are you ordering a CV Procedure (e.g. stress test, cath, DCCV, TEE, etc)?   Press F2        :[id number]}   Dispo:  No follow-ups on file.   Medication Adjustments/Labs and Tests Ordered: Current medicines are reviewed at length with the patient today.  Concerns regarding medicines are outlined above.  Tests Ordered: No orders of the defined types were placed in this encounter.  Medication Changes: No orders of the defined types were placed in this encounter.  Signed, Ermalinda Barrios, PA-C  12/06/2021 8:09 AM    [address], [place], [place]  [address] Phone: [phone]; Fax: [phone]

## 2021-12-18 DIAGNOSIS — M25571 Pain in right ankle and joints of right foot: Secondary | ICD-10-CM | POA: Diagnosis not present

## 2021-12-19 ENCOUNTER — Other Ambulatory Visit: Payer: Self-pay | Admitting: Interventional Cardiology

## 2021-12-19 ENCOUNTER — Ambulatory Visit: Payer: PPO | Admitting: Physician Assistant

## 2021-12-20 NOTE — Progress Notes (Signed)
Office Visit    Patient Name: [name] Date of Encounter: 12/21/2021  PCP:  Carol Ada, [facility]  Cardiologist:  Sinclair Grooms, MD  Advanced Practice Provider:  No care team member to display Electrophysiologist:  None  HPI    [name] is a 66 y.o. female with a past medical history for PSVT controlled on beta-blocker therapy, hyperlipidemia, and abnormal CAC score 1.7 presents today for annual follow-up visit.  She was seen last year and was doing well.  She had a lot of questions about her current preventative therapy.  No symptoms at that time.  Today, she states she has been doing pretty well without any cardiac related issues. She has been trying to increase her exercise but has been in the wheelchair the last few months due to her foot. It is finally feeling a little better and she is now walking with a cane. She wants to loose some weight and is asking me about a weight loss pill with GOLO. I would need to research the ingredients more to see if they would interact with her cardiac medications. I encouraged her to also discuss this with her primary.   Reports no shortness of breath nor dyspnea on exertion. Reports no chest pain, pressure, or tightness. No edema, orthopnea, PND. Reports no palpitations.    Past Medical History    Past Medical History:  Diagnosis Date   Arthritis    Complication of anesthesia    sensitive to meds   Dysrhythmia    hx psvt   Genetic testing 07/26/2016   Ms. Pitz underwent genetic counseling and testing for hereditary cancer syndromes on 07/11/2016. 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   The remaining 45 genes analyzed by [employer]'s 46-gene Common Hereditary Cancers Panel were negative for mutations. Genes analyzed include: APC, ATM, AXIN2, BARD1, BMPR1A, BRCA1, BRCA2, BRIP1, CDH1, CDKN2A, CHE   HOH (hard of hearing)    IBS  (irritable bowel syndrome)    Lipoma    Left arm   Monoallelic mutation of CHEK2 gene in female patient 07/26/2016   Ms. Randon underwent genetic counseling and testing for hereditary cancer syndromes on 07/11/2016. 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   The remaining 45 genes analyzed by [employer]'s 46-gene Common Hereditary Cancers Panel were negative for mutations. Genes analyzed include: APC, ATM, AXIN2, BARD1, BMPR1A, BRCA1, BRCA2, BRIP1, CDH1, CDKN2A, CHE   Seasonal allergies    Tachycardia    PVTS   Past Surgical History:  Procedure Laterality Date   BACK SURGERY  2009   BACK SURGERY     CARPAL TUNNEL RELEASE  2000   bilateral   LIPOMA EXCISION  05/24/2011   Procedure: EXCISION LIPOMA;  Surgeon: Imogene Burn. Georgette Dover, MD;  Location: [facility];  Service: General;  Laterality: Left;  excision of left arm lipoma   TONSILLECTOMY      Allergies  No Known Allergies   EKGs/Labs/Other Studies Reviewed:   The following studies were reviewed today:  Coronary calcium score: FINDINGS: Coronary arteries: Normal origins.   Coronary Calcium Score:   Left main: 0   Left anterior descending artery: 1.71   Left circumflex artery: 0   Right coronary artery: 0   Total: 1.71   Percentile: 55   Pericardium: Normal.   Ascending Aorta: Normal caliber.   Non-cardiac: See separate report from [facility].  IMPRESSION: Coronary calcium score of 1.71. This was 1 percentile for age-, race-, and sex-matched controls. EKG:  EKG is  ordered today.  The ekg ordered today demonstrates SB, rate 56bpm, first degree AV block  Recent Labs: No results found for requested labs within last 365 days.  Recent Lipid Panel    Component Value Date/Time   CHOL 129 11/10/2020 0955   TRIG 72 11/10/2020 0955   HDL 62 11/10/2020 0955   CHOLHDL 2.1 11/10/2020 0955   LDLCALC 52 11/10/2020 0955    Home Medications    Current Meds  Medication Sig   atenolol (TENORMIN) 25 MG tablet Take 1 tablet (25 mg total) by mouth daily.   Clobetasol Prop Emollient Base 0.05 % emollient cream Apply one application to genital area once a week as needed for moisture.   loratadine (CLARITIN) 10 MG tablet Take 10 mg by mouth daily as needed. For allergies   rosuvastatin (CRESTOR) 20 MG tablet TAKE 1 TABLET BY MOUTH EVERY DAY   [DISCONTINUED] DUREZOL 0.05 % EMUL      Review of Systems      All other systems reviewed and are otherwise negative except as noted above.  Physical Exam    VS:  BP 120/70   Pulse (!) 56   Ht _0  (1.575 m)   Wt 180 lb 6.4 oz (81.8 kg)   SpO2 98%   BMI 33.00 kg/m  , BMI Body mass index is 33 kg/m.  Wt Readings from Last 3 Encounters:  12/21/21 180 lb 6.4 oz (81.8 kg)  11/13/20 172 lb 6.4 oz (78.2 kg)  10/08/19 175 lb 4.3 oz (79.5 kg)     GEN: Well nourished, well developed, in no acute distress. HEENT: normal. Neck: Supple, no JVD, carotid bruits, or masses. Cardiac: RRR, no murmurs, rubs, or gallops. No clubbing, cyanosis, edema.  Radials/PT 2+ and equal bilaterally.  Respiratory:  Respirations regular and unlabored, clear to auscultation bilaterally. GI: Soft, nontender, nondistended. MS: No deformity or atrophy. Skin: Warm and dry, no rash. Neuro:  Strength and sensation are intact. Psych: Normal affect.  Assessment & Plan    PSVT -well controlled on current dose of Atenolol 12.64m BID -continue to monitor HR, SB today but asymptomatic -she has been on this dose for years and it works well to suppress her palpitations  Hyperlipidemia LDL goal less than 100 -She wants to decrease her crestor since her last LDL was 52.  -We can try 158mdaily and repeat a Lipid panel in 8 weeks  CAC score goal less than 100 -continue current medication regimen       Disposition: Follow up 1 year with HeSinclair GroomsMD or APP.  Signed, TeElgie CollardPA-C 12/21/2021, 9:18  AM [place]

## 2021-12-21 ENCOUNTER — Encounter: Payer: Self-pay | Admitting: Physician Assistant

## 2021-12-21 ENCOUNTER — Ambulatory Visit: Payer: PPO | Attending: Physician Assistant | Admitting: Physician Assistant

## 2021-12-21 VITALS — BP 120/70 | HR 56 | Ht 62.0 in | Wt 180.4 lb

## 2021-12-21 DIAGNOSIS — Z79899 Other long term (current) drug therapy: Secondary | ICD-10-CM | POA: Diagnosis not present

## 2021-12-21 DIAGNOSIS — R931 Abnormal findings on diagnostic imaging of heart and coronary circulation: Secondary | ICD-10-CM | POA: Diagnosis not present

## 2021-12-21 DIAGNOSIS — E785 Hyperlipidemia, unspecified: Secondary | ICD-10-CM

## 2021-12-21 DIAGNOSIS — I471 Supraventricular tachycardia, unspecified: Secondary | ICD-10-CM | POA: Diagnosis not present

## 2021-12-21 MED ORDER — ROSUVASTATIN CALCIUM 10 MG PO TABS: 10.0000 mg | ORAL_TABLET | Freq: Every day | ORAL | 3 refills | Status: DC

## 2021-12-21 NOTE — Patient Instructions (Signed)
Medication Instructions:  Your physician has recommended you make the following change in your medication:  1.Decrease crestor to 10 mg daily  *If you need a refill on your cardiac medications before your next appointment, please call your pharmacy*   Lab Work: Fasting lipid panel in 8 weeks If you have labs (blood work) drawn today and your tests are completely normal, you will receive your results only by: [place] (if you have MyChart) OR A paper copy in the mail If you have any lab test that is abnormal or we need to change your treatment, we will call you to review the results.   Follow-Up: At [facility], you and your health needs are our priority.  As part of our continuing mission to provide you with exceptional heart care, we have created designated Provider Care Teams.  These Care Teams include your primary Cardiologist (physician) and Advanced Practice Providers (APPs -  Physician Assistants and Nurse Practitioners) who all work together to provide you with the care you need, when you need it.  We recommend signing up for the patient portal called "MyChart".  Sign up information is provided on this After Visit Summary.  MyChart is used to connect with patients for Virtual Visits (Telemedicine).  Patients are able to view lab/test results, encounter notes, upcoming appointments, etc.  Non-urgent messages can be sent to your provider as well.   To learn more about what you can do with MyChart, go to [email or web address].    Your next appointment:   1 year(s)  The format for your next appointment:   In Person  Provider:   Dr Johney Frame  Important Information About Sugar

## 2021-12-27 DIAGNOSIS — M76821 Posterior tibial tendinitis, right leg: Secondary | ICD-10-CM | POA: Diagnosis not present

## 2021-12-27 DIAGNOSIS — Q6689 Other  specified congenital deformities of feet: Secondary | ICD-10-CM | POA: Diagnosis not present

## 2022-01-11 ENCOUNTER — Other Ambulatory Visit: Payer: Self-pay | Admitting: Nurse Practitioner

## 2022-01-11 DIAGNOSIS — R898 Other abnormal findings in specimens from other organs, systems and tissues: Secondary | ICD-10-CM

## 2022-01-11 DIAGNOSIS — Z803 Family history of malignant neoplasm of breast: Secondary | ICD-10-CM

## 2022-01-22 DIAGNOSIS — E782 Mixed hyperlipidemia: Secondary | ICD-10-CM | POA: Diagnosis not present

## 2022-01-22 DIAGNOSIS — I471 Supraventricular tachycardia, unspecified: Secondary | ICD-10-CM | POA: Diagnosis not present

## 2022-01-22 DIAGNOSIS — M2550 Pain in unspecified joint: Secondary | ICD-10-CM | POA: Diagnosis not present

## 2022-01-22 DIAGNOSIS — Z23 Encounter for immunization: Secondary | ICD-10-CM | POA: Diagnosis not present

## 2022-01-22 DIAGNOSIS — Z0000 Encounter for general adult medical examination without abnormal findings: Secondary | ICD-10-CM | POA: Diagnosis not present

## 2022-01-22 DIAGNOSIS — Z1331 Encounter for screening for depression: Secondary | ICD-10-CM | POA: Diagnosis not present

## 2022-01-25 DIAGNOSIS — M79671 Pain in right foot: Secondary | ICD-10-CM | POA: Diagnosis not present

## 2022-01-28 ENCOUNTER — Ambulatory Visit
Admission: RE | Admit: 2022-01-28 | Discharge: 2022-01-28 | Disposition: A | Discharge status: Home / Self Care | Payer: PPO | Source: Ambulatory Visit | Attending: Nurse Practitioner | Admitting: Nurse Practitioner

## 2022-01-28 DIAGNOSIS — Z803 Family history of malignant neoplasm of breast: Secondary | ICD-10-CM

## 2022-01-28 DIAGNOSIS — R898 Other abnormal findings in specimens from other organs, systems and tissues: Secondary | ICD-10-CM

## 2022-01-30 DIAGNOSIS — M79671 Pain in right foot: Secondary | ICD-10-CM | POA: Diagnosis not present

## 2022-02-06 ENCOUNTER — Ambulatory Visit
Admission: RE | Admit: 2022-02-06 | Discharge: 2022-02-06 | Disposition: A | Discharge status: Home / Self Care | Payer: PPO | Source: Ambulatory Visit | Attending: Nurse Practitioner | Admitting: Nurse Practitioner

## 2022-02-06 DIAGNOSIS — M25571 Pain in right ankle and joints of right foot: Secondary | ICD-10-CM | POA: Diagnosis not present

## 2022-02-06 DIAGNOSIS — N6489 Other specified disorders of breast: Secondary | ICD-10-CM | POA: Diagnosis not present

## 2022-02-06 MED ORDER — GADOPICLENOL 0.5 MMOL/ML IV SOLN
7.0000 mL | Freq: Once | INTRAVENOUS | Status: AC | PRN
  Administered 2022-02-06: 7 mL via INTRAVENOUS

## 2022-02-11 ENCOUNTER — Telehealth: Payer: Self-pay | Admitting: Physician Assistant

## 2022-02-11 NOTE — Telephone Encounter (Signed)
Patient had a lipid panel done at her PCP on 1/16, she would like for Tessa to review it.  Patient said the last time she was in the office her cholesterol medication dosage was changed from '20mg'$  to '10mg'$ . She wants to make sure when she had the lab work done at her PCP it was even time for the medication to work. She wants to know if it's still necessary to come in to her lab appt this Friday. Please advise.

## 2022-02-12 DIAGNOSIS — L821 Other seborrheic keratosis: Secondary | ICD-10-CM | POA: Diagnosis not present

## 2022-02-12 DIAGNOSIS — D225 Melanocytic nevi of trunk: Secondary | ICD-10-CM | POA: Diagnosis not present

## 2022-02-12 DIAGNOSIS — L578 Other skin changes due to chronic exposure to nonionizing radiation: Secondary | ICD-10-CM | POA: Diagnosis not present

## 2022-02-12 DIAGNOSIS — L639 Alopecia areata, unspecified: Secondary | ICD-10-CM | POA: Diagnosis not present

## 2022-02-12 DIAGNOSIS — L814 Other melanin hyperpigmentation: Secondary | ICD-10-CM | POA: Diagnosis not present

## 2022-02-12 NOTE — Telephone Encounter (Signed)
Canceled lab appointment due to provider request.  The patient has been notified of the result and verbalized understanding.  All questions (if any) were answered. Gershon Crane, LPN 0/01/2222 1:14 AM

## 2022-02-13 DIAGNOSIS — M79671 Pain in right foot: Secondary | ICD-10-CM | POA: Diagnosis not present

## 2022-02-13 DIAGNOSIS — M25571 Pain in right ankle and joints of right foot: Secondary | ICD-10-CM | POA: Diagnosis not present

## 2022-02-15 ENCOUNTER — Other Ambulatory Visit: Payer: PPO

## 2022-03-15 ENCOUNTER — Other Ambulatory Visit: Payer: Self-pay | Admitting: *Deleted

## 2022-03-15 DIAGNOSIS — M79671 Pain in right foot: Secondary | ICD-10-CM | POA: Diagnosis not present

## 2022-03-15 MED ORDER — ATENOLOL 25 MG PO TABS: 25.0000 mg | ORAL_TABLET | Freq: Every day | ORAL | 3 refills | Status: DC

## 2022-04-02 DIAGNOSIS — Q6689 Other  specified congenital deformities of feet: Secondary | ICD-10-CM | POA: Diagnosis not present

## 2022-04-02 DIAGNOSIS — M76821 Posterior tibial tendinitis, right leg: Secondary | ICD-10-CM | POA: Diagnosis not present

## 2022-04-20 DIAGNOSIS — S46812A Strain of other muscles, fascia and tendons at shoulder and upper arm level, left arm, initial encounter: Secondary | ICD-10-CM | POA: Diagnosis not present

## 2022-04-20 DIAGNOSIS — S46811A Strain of other muscles, fascia and tendons at shoulder and upper arm level, right arm, initial encounter: Secondary | ICD-10-CM | POA: Diagnosis not present

## 2022-05-09 DIAGNOSIS — H903 Sensorineural hearing loss, bilateral: Secondary | ICD-10-CM | POA: Diagnosis not present

## 2022-05-14 DIAGNOSIS — H903 Sensorineural hearing loss, bilateral: Secondary | ICD-10-CM | POA: Diagnosis not present

## 2022-05-15 DIAGNOSIS — L639 Alopecia areata, unspecified: Secondary | ICD-10-CM | POA: Diagnosis not present

## 2022-05-23 DIAGNOSIS — Z800 Family history of malignant neoplasm of digestive organs: Secondary | ICD-10-CM | POA: Diagnosis not present

## 2022-05-23 DIAGNOSIS — D128 Benign neoplasm of rectum: Secondary | ICD-10-CM | POA: Diagnosis not present

## 2022-05-23 DIAGNOSIS — K640 First degree hemorrhoids: Secondary | ICD-10-CM | POA: Diagnosis not present

## 2022-05-23 DIAGNOSIS — K5730 Diverticulosis of large intestine without perforation or abscess without bleeding: Secondary | ICD-10-CM | POA: Diagnosis not present

## 2022-05-23 DIAGNOSIS — Z1211 Encounter for screening for malignant neoplasm of colon: Secondary | ICD-10-CM | POA: Diagnosis not present

## 2022-05-23 DIAGNOSIS — D125 Benign neoplasm of sigmoid colon: Secondary | ICD-10-CM | POA: Diagnosis not present

## 2022-05-27 DIAGNOSIS — D128 Benign neoplasm of rectum: Secondary | ICD-10-CM | POA: Diagnosis not present

## 2022-05-31 ENCOUNTER — Ambulatory Visit: Payer: PPO | Admitting: Podiatry

## 2022-06-04 ENCOUNTER — Ambulatory Visit: Payer: PPO | Admitting: Podiatry

## 2022-06-04 DIAGNOSIS — L603 Nail dystrophy: Secondary | ICD-10-CM | POA: Diagnosis not present

## 2022-06-04 NOTE — Progress Notes (Signed)
Chief Complaint  Patient presents with   Nail Problem    Patient came in today for bilateral nail fungus, left hallux, right hallux and 2nd toe, nial are thick and yellow,     HPI: 67 y.o. female presenting today for new complaint of slight thickening especially to the first and second digit of the bilateral toes.  Patient noticed over the last few years that the nails are slightly thicker with some white discoloration at the distal tips of the toenail plate they are not necessarily painful.  She presents for further treatment and evaluation  Past Medical History:  Diagnosis Date   Arthritis    Complication of anesthesia    sensitive to meds   Dysrhythmia    hx psvt   Genetic testing 07/26/2016   Ms. Demsky underwent genetic counseling and testing for hereditary cancer syndromes on 07/11/2016. 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   The remaining 45 genes analyzed by [employer]'s 46-gene Common Hereditary Cancers Panel were negative for mutations. 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 Triano underwent genetic counseling and testing for hereditary cancer syndromes on 07/11/2016. 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   The remaining 45 genes analyzed by [employer]'s 46-gene Common Hereditary Cancers Panel were negative for mutations. Genes analyzed include: APC, ATM, AXIN2, BARD1, BMPR1A, BRCA1, BRCA2, BRIP1, CDH1, CDKN2A, CHE   Seasonal allergies    Tachycardia    PVTS    Past Surgical History:  Procedure Laterality Date   BACK SURGERY  2009   BACK SURGERY     CARPAL TUNNEL RELEASE  2000   bilateral   LIPOMA EXCISION  05/24/2011   Procedure: EXCISION LIPOMA;  Surgeon: Wilmon Arms.  Corliss Skains, MD;  Location: [facility];  Service: General;  Laterality: Left;  excision of left arm lipoma   TONSILLECTOMY      No Known Allergies   Physical Exam: General: The patient is alert and oriented x3 in no acute distress.  Dermatology: Skin is warm, dry and supple bilateral lower extremities.  Very slight hyperkeratotic nail plate noted to the great toe and second toe.  Clinically these are the longest toes of the foot parabola and likely the nail plates are thicker secondary to shoe gear and microtrauma  Vascular: Palpable pedal pulses bilaterally. Capillary refill within normal limits.  No appreciable edema.  No erythema.  Neurological: Grossly intact via light touch  Musculoskeletal Exam: No gross pedal deformities noted  Assessment/Plan of Care: 1.  Mild dystrophic nails bilateral first and second digits likely due to microtrauma  -Patient evaluated.  -The patient has no symptoms associated to the thicker toenails.  Continue conservative treatment including routine debridement of the nails and good foot hygiene. -We discussed different treatment options for possible toenail fungus but she ultimately declined -Return to clinic as needed       Felecia Shelling, DPM [facility]  Dr. Felecia Shelling, DPM    [address]  [place], [place] [address]                Office [phone]  Fax [phone]

## 2022-06-12 ENCOUNTER — Other Ambulatory Visit: Payer: Self-pay

## 2022-06-12 MED ORDER — ROSUVASTATIN CALCIUM 10 MG PO TABS: 10.0000 mg | ORAL_TABLET | Freq: Every day | ORAL | 1 refills | Status: DC

## 2022-06-13 ENCOUNTER — Other Ambulatory Visit: Payer: Self-pay

## 2022-06-13 MED ORDER — ROSUVASTATIN CALCIUM 10 MG PO TABS: 10.0000 mg | ORAL_TABLET | Freq: Every day | ORAL | 1 refills | Status: DC

## 2022-06-13 NOTE — Telephone Encounter (Signed)
[name] medication was sent to pt's pharmacy as requested. Confirmation received.  °

## 2022-06-18 ENCOUNTER — Other Ambulatory Visit: Payer: Self-pay | Admitting: *Deleted

## 2022-07-19 ENCOUNTER — Encounter: Payer: Self-pay | Admitting: Family Medicine

## 2022-07-19 ENCOUNTER — Other Ambulatory Visit: Payer: Self-pay | Admitting: Family Medicine

## 2022-07-19 DIAGNOSIS — Z1231 Encounter for screening mammogram for malignant neoplasm of breast: Secondary | ICD-10-CM

## 2022-07-23 ENCOUNTER — Ambulatory Visit
Admission: RE | Admit: 2022-07-23 | Discharge: 2022-07-23 | Disposition: A | Discharge status: Home / Self Care | Payer: PPO | Source: Ambulatory Visit | Attending: Family Medicine | Admitting: Family Medicine

## 2022-07-23 DIAGNOSIS — Z1231 Encounter for screening mammogram for malignant neoplasm of breast: Secondary | ICD-10-CM

## 2022-07-29 DIAGNOSIS — H40013 Open angle with borderline findings, low risk, bilateral: Secondary | ICD-10-CM | POA: Diagnosis not present

## 2022-07-29 DIAGNOSIS — Z961 Presence of intraocular lens: Secondary | ICD-10-CM | POA: Diagnosis not present

## 2022-07-29 DIAGNOSIS — H26491 Other secondary cataract, right eye: Secondary | ICD-10-CM | POA: Diagnosis not present

## 2022-09-02 DIAGNOSIS — Z8742 Personal history of other diseases of the female genital tract: Secondary | ICD-10-CM | POA: Diagnosis not present

## 2022-09-02 DIAGNOSIS — R898 Other abnormal findings in specimens from other organs, systems and tissues: Secondary | ICD-10-CM | POA: Diagnosis not present

## 2022-09-02 DIAGNOSIS — Z9189 Other specified personal risk factors, not elsewhere classified: Secondary | ICD-10-CM | POA: Diagnosis not present

## 2022-09-08 DIAGNOSIS — R051 Acute cough: Secondary | ICD-10-CM | POA: Diagnosis not present

## 2022-09-08 DIAGNOSIS — Z03818 Encounter for observation for suspected exposure to other biological agents ruled out: Secondary | ICD-10-CM | POA: Diagnosis not present

## 2022-09-08 DIAGNOSIS — J069 Acute upper respiratory infection, unspecified: Secondary | ICD-10-CM | POA: Diagnosis not present

## 2022-09-10 DIAGNOSIS — J22 Unspecified acute lower respiratory infection: Secondary | ICD-10-CM | POA: Diagnosis not present

## 2022-09-10 DIAGNOSIS — R059 Cough, unspecified: Secondary | ICD-10-CM | POA: Diagnosis not present

## 2022-09-14 DIAGNOSIS — J22 Unspecified acute lower respiratory infection: Secondary | ICD-10-CM | POA: Diagnosis not present

## 2022-09-21 DIAGNOSIS — J189 Pneumonia, unspecified organism: Secondary | ICD-10-CM | POA: Diagnosis not present

## 2022-09-26 DIAGNOSIS — R0989 Other specified symptoms and signs involving the circulatory and respiratory systems: Secondary | ICD-10-CM | POA: Diagnosis not present

## 2022-10-30 DIAGNOSIS — K529 Noninfective gastroenteritis and colitis, unspecified: Secondary | ICD-10-CM | POA: Diagnosis not present

## 2022-11-06 DIAGNOSIS — S93602A Unspecified sprain of left foot, initial encounter: Secondary | ICD-10-CM | POA: Diagnosis not present

## 2022-12-05 ENCOUNTER — Other Ambulatory Visit: Payer: Self-pay | Admitting: Physician Assistant

## 2023-02-11 DIAGNOSIS — K529 Noninfective gastroenteritis and colitis, unspecified: Secondary | ICD-10-CM | POA: Diagnosis not present

## 2023-03-14 DIAGNOSIS — J22 Unspecified acute lower respiratory infection: Secondary | ICD-10-CM | POA: Diagnosis not present

## 2023-04-01 DIAGNOSIS — J019 Acute sinusitis, unspecified: Secondary | ICD-10-CM | POA: Diagnosis not present

## 2023-04-01 DIAGNOSIS — R058 Other specified cough: Secondary | ICD-10-CM | POA: Diagnosis not present

## 2023-05-05 DIAGNOSIS — J069 Acute upper respiratory infection, unspecified: Secondary | ICD-10-CM | POA: Diagnosis not present

## 2023-07-09 ENCOUNTER — Other Ambulatory Visit: Payer: Self-pay | Admitting: Family Medicine

## 2023-07-09 DIAGNOSIS — Z1231 Encounter for screening mammogram for malignant neoplasm of breast: Secondary | ICD-10-CM

## 2023-07-22 DIAGNOSIS — L821 Other seborrheic keratosis: Secondary | ICD-10-CM | POA: Diagnosis not present

## 2023-07-24 ENCOUNTER — Ambulatory Visit
Admission: RE | Admit: 2023-07-24 | Discharge: 2023-07-24 | Disposition: A | Discharge status: Home / Self Care | Source: Ambulatory Visit | Attending: Family Medicine | Admitting: Family Medicine

## 2023-07-24 DIAGNOSIS — Z1231 Encounter for screening mammogram for malignant neoplasm of breast: Secondary | ICD-10-CM

## 2023-08-07 DIAGNOSIS — E782 Mixed hyperlipidemia: Secondary | ICD-10-CM | POA: Diagnosis not present

## 2023-08-07 DIAGNOSIS — K529 Noninfective gastroenteritis and colitis, unspecified: Secondary | ICD-10-CM | POA: Diagnosis not present

## 2023-08-07 DIAGNOSIS — Z23 Encounter for immunization: Secondary | ICD-10-CM | POA: Diagnosis not present

## 2023-08-07 DIAGNOSIS — I471 Supraventricular tachycardia, unspecified: Secondary | ICD-10-CM | POA: Diagnosis not present

## 2023-08-13 DIAGNOSIS — M5412 Radiculopathy, cervical region: Secondary | ICD-10-CM | POA: Diagnosis not present

## 2023-09-18 DIAGNOSIS — L900 Lichen sclerosus et atrophicus: Secondary | ICD-10-CM | POA: Diagnosis not present

## 2023-09-18 DIAGNOSIS — M8589 Other specified disorders of bone density and structure, multiple sites: Secondary | ICD-10-CM | POA: Diagnosis not present

## 2023-09-18 DIAGNOSIS — N958 Other specified menopausal and perimenopausal disorders: Secondary | ICD-10-CM | POA: Diagnosis not present

## 2023-09-18 DIAGNOSIS — Z01419 Encounter for gynecological examination (general) (routine) without abnormal findings: Secondary | ICD-10-CM | POA: Diagnosis not present

## 2023-09-18 DIAGNOSIS — R898 Other abnormal findings in specimens from other organs, systems and tissues: Secondary | ICD-10-CM | POA: Diagnosis not present

## 2023-09-19 ENCOUNTER — Other Ambulatory Visit (HOSPITAL_BASED_OUTPATIENT_CLINIC_OR_DEPARTMENT_OTHER): Payer: Self-pay | Admitting: Nurse Practitioner

## 2023-09-19 DIAGNOSIS — M8589 Other specified disorders of bone density and structure, multiple sites: Secondary | ICD-10-CM

## 2023-09-22 ENCOUNTER — Other Ambulatory Visit: Payer: Self-pay | Admitting: Nurse Practitioner

## 2023-09-22 DIAGNOSIS — Z9189 Other specified personal risk factors, not elsewhere classified: Secondary | ICD-10-CM

## 2023-09-22 DIAGNOSIS — R898 Other abnormal findings in specimens from other organs, systems and tissues: Secondary | ICD-10-CM

## 2023-10-07 DIAGNOSIS — M25561 Pain in right knee: Secondary | ICD-10-CM | POA: Diagnosis not present

## 2023-10-07 DIAGNOSIS — M170 Bilateral primary osteoarthritis of knee: Secondary | ICD-10-CM | POA: Diagnosis not present

## 2023-10-27 ENCOUNTER — Telehealth: Payer: Self-pay | Admitting: Physician Assistant

## 2023-10-27 NOTE — Telephone Encounter (Signed)
 Spoke with pt regarding Tessa Conte's comments. Pt scheduled 10/30. Pt verbalized understanding. All questions if any were answered.

## 2023-10-27 NOTE — Telephone Encounter (Signed)
 Spoke to patient and she reports that she has been having increased fatigue and decreased libido. Pt believes that it might be due to the long term use of atenolol . Pt would like to look at changing dose. Pt advised that she needs appointment since she has not been seen since 2023. Appt has been made for 10/30 and advised to monitor blood pressure and heart rate in the meantime.

## 2023-10-27 NOTE — Telephone Encounter (Signed)
 Pt c/o medication issue:  1. Name of Medication:   atenolol  (TENORMIN ) 25 MG tablet    2. How are you currently taking this medication (dosage and times per day)? Take 1 tablet (25 mg total) by mouth daily.   3. Are you having a reaction (difficulty breathing--STAT)? No  4. What is your medication issue? Pt is complaining of severe fatigue. She believes it may be related to her medication. Pt would like to discuss medication management. Please advise.

## 2023-10-28 DIAGNOSIS — M1712 Unilateral primary osteoarthritis, left knee: Secondary | ICD-10-CM | POA: Diagnosis not present

## 2023-10-28 DIAGNOSIS — M25561 Pain in right knee: Secondary | ICD-10-CM | POA: Diagnosis not present

## 2023-10-28 DIAGNOSIS — M25562 Pain in left knee: Secondary | ICD-10-CM | POA: Diagnosis not present

## 2023-10-28 DIAGNOSIS — M1711 Unilateral primary osteoarthritis, right knee: Secondary | ICD-10-CM | POA: Diagnosis not present

## 2023-11-04 NOTE — Progress Notes (Unsigned)
 Office Visit    Patient Name: [name] Date of Encounter: 11/06/2023  PCP:  Claudene Pellet, MD   [facility]  Cardiologist:  Emeline FORBES Calender, MD  Advanced Practice Provider:  No care team member to display Electrophysiologist:  None  HPI    [name] is a 68 y.o. female with a past medical history for PSVT controlled on beta-blocker therapy, hyperlipidemia, and abnormal CAC score 1.7 presents today for annual follow-up visit.  She was seen last year and was doing well.  She had a lot of questions about her current preventative therapy.  No symptoms at that time.  I saw her December 2023, she states she has been doing pretty well without any cardiac related issues. She has been trying to increase her exercise but has been in the wheelchair the last few months due to her foot. It is finally feeling a little better and she is now walking with a cane. She wants to loose some weight and is asking me about a weight loss pill with GOLO. I would need to research the ingredients more to see if they would interact with her cardiac medications. I encouraged her to also discuss this with her primary.   Reports no shortness of breath nor dyspnea on exertion. Reports no chest pain, pressure, or tightness. No edema, orthopnea, PND. Reports no palpitations.   Today, she presents with fatigue.  She experiences episodes of tachycardia, occurring about twice a year, and has been on atenolol  12.5 mg daily for nearly 20 years, which has been effective in managing her symptoms. She avoids caffeine, which she believes contributes to her fatigue. Her blood pressure readings at home have been low, sometimes reaching 96/65 mmHg.  She describes persistent fatigue, feeling more tired upon waking than before sleep. She takes vitamin D, calcium , and vitamin B supplements. She has recently started using estrogen cream for menopausal symptoms.  Her cholesterol levels are  well-managed with Crestor , with an LDL of 81 mg/dL and total cholesterol of 153 mg/dL. A calcium  score in 2022 placed her in the 55th percentile for coronary artery calcium .  Reports no shortness of breath nor dyspnea on exertion. Reports no chest pain, pressure, or tightness. No edema, orthopnea, PND. Reports no palpitations.   Discussed the use of AI scribe software for clinical note transcription with the patient, who gave verbal consent to proceed.   Past Medical History    Past Medical History:  Diagnosis Date   Arthritis    Complication of anesthesia    sensitive to meds   Dysrhythmia    hx psvt   Genetic testing 07/26/2016   Ms. Bade underwent genetic counseling and testing for hereditary cancer syndromes on 07/11/2016. 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   The remaining 45 genes analyzed by [employer]'s 46-gene Common Hereditary Cancers Panel were negative for mutations. 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 Southall underwent genetic counseling and testing for hereditary cancer syndromes on 07/11/2016. 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   The remaining 45 genes analyzed by [employer]'s 46-gene Common Hereditary Cancers Panel were negative for mutations. Genes analyzed include: APC, ATM, AXIN2, BARD1, BMPR1A, BRCA1,  BRCA2, BRIP1, CDH1, CDKN2A, CHE   Seasonal allergies    Tachycardia    PVTS   Past Surgical History:  Procedure Laterality Date   BACK SURGERY  2009   BACK SURGERY     CARPAL TUNNEL RELEASE  2000   bilateral   LIPOMA EXCISION  05/24/2011   Procedure: EXCISION LIPOMA;  Surgeon: Donnice POUR. Belinda, MD;  Location: [facility];  Service: General;   Laterality: Left;  excision of left arm lipoma   TONSILLECTOMY      Allergies  Allergies  Allergen Reactions   Amoxicillin-Pot Clavulanate Diarrhea     EKGs/Labs/Other Studies Reviewed:   The following studies were reviewed today:  Coronary calcium  score: FINDINGS: Coronary arteries: Normal origins.   Coronary Calcium  Score:   Left main: 0   Left anterior descending artery: 1.71   Left circumflex artery: 0   Right coronary artery: 0   Total: 1.71   Percentile: 55   Pericardium: Normal.   Ascending Aorta: Normal caliber.   Non-cardiac: See separate report from [facility].   IMPRESSION: Coronary calcium  score of 1.71. This was 80 percentile for age-, race-, and sex-matched controls. EKG:  EKG is  ordered today.  The ekg ordered today demonstrates SB, rate 56bpm, first degree AV block  Recent Labs: No results found for requested labs within last 365 days.  Recent Lipid Panel    Component Value Date/Time   CHOL 129 11/10/2020 0955   TRIG 72 11/10/2020 0955   HDL 62 11/10/2020 0955   CHOLHDL 2.1 11/10/2020 0955   LDLCALC 52 11/10/2020 0955    Home Medications   Current Meds  Medication Sig   Clobetasol Prop Emollient Base 0.05 % emollient cream Apply one application to genital area once a week as needed for moisture.   estradiol (ESTRACE) 0.01 % CREA vaginal cream Place 1 Applicatorful vaginally 2 (two) times a week.   loratadine (CLARITIN) 10 MG tablet Take 10 mg by mouth daily as needed. For allergies   rosuvastatin  (CRESTOR ) 10 MG tablet TAKE 1 TABLET BY MOUTH EVERY DAY   [DISCONTINUED] atenolol  (TENORMIN ) 25 MG tablet Take 1 tablet (25 mg total) by mouth daily.     Review of Systems      All other systems reviewed and are otherwise negative except as noted above.  Physical Exam    VS:  BP 110/66   Pulse 80   Resp 16   Ht 5' 2 (1.575 m)   Wt 179 lb (81.2 kg)   SpO2 96%   BMI 32.74 kg/m  , BMI Body mass index is 32.74  kg/m.  Wt Readings from Last 3 Encounters:  11/06/23 179 lb (81.2 kg)  12/21/21 180 lb 6.4 oz (81.8 kg)  11/13/20 172 lb 6.4 oz (78.2 kg)     GEN: Well nourished, well developed, in no acute distress. HEENT: normal. Neck: Supple, no JVD, carotid bruits, or masses. Cardiac: RRR, no murmurs, rubs, or gallops. No clubbing, cyanosis, edema.  Radials/PT 2+ and equal bilaterally.  Respiratory:  Respirations regular and unlabored, clear to auscultation bilaterally. GI: Soft, nontender, nondistended. MS: No deformity or atrophy. Skin: Warm and dry, no rash. Neuro:  Strength and sensation are intact. Psych: Normal affect.  Assessment & Plan    Supraventricular tachycardia SVT is infrequent, managed with low-dose atenolol . Fatigue and low libido may be related to atenolol . Discussed dose reduction to assess symptom improvement. - Reduce atenolol  to half a tablet once daily in the morning. -  Monitor symptoms and consider discontinuing atenolol  if symptoms improve and SVT remains infrequent.  Fatigue Fatigue is multifactorial, possibly related to low blood pressure, vitamin deficiencies, and medication side effects. Recent labs normal. Vitamin D, calcium , and B supplements ongoing. Water aerobics for stamina and joint health. - Continue vitamin D and calcium  supplementation. - Continue vitamin B supplementation. - Engage in water aerobics three times a week. - Monitor fatigue levels after reducing atenolol  dosage. -CT calcium  scoring reviewed with the patient   Hyperlipidemia with coronary artery calcium  Managed with Crestor  10 mg. Previous calcium  score indicates low coronary artery disease risk. LDL goal <70 mg/dL, recent level 81 mg/dL. Plan to monitor cholesterol with upcoming labs. - Continue Crestor  10 mg at night. - Monitor cholesterol levels with upcoming labs in January. - Adjust Crestor  dosage if necessary based on lab results.       Disposition: Follow up 1 year with Emeline FORBES Calender, MD or APP.  Signed, Orren LOISE Fabry, PA-C 11/06/2023, 5:05 PM [facility]

## 2023-11-06 ENCOUNTER — Encounter: Payer: Self-pay | Admitting: Physician Assistant

## 2023-11-06 ENCOUNTER — Ambulatory Visit: Attending: Internal Medicine | Admitting: Physician Assistant

## 2023-11-06 VITALS — BP 110/66 | HR 80 | Resp 16 | Ht 62.0 in | Wt 179.0 lb

## 2023-11-06 DIAGNOSIS — R931 Abnormal findings on diagnostic imaging of heart and coronary circulation: Secondary | ICD-10-CM

## 2023-11-06 DIAGNOSIS — E785 Hyperlipidemia, unspecified: Secondary | ICD-10-CM | POA: Diagnosis not present

## 2023-11-06 DIAGNOSIS — Z79899 Other long term (current) drug therapy: Secondary | ICD-10-CM | POA: Diagnosis not present

## 2023-11-06 DIAGNOSIS — I471 Supraventricular tachycardia, unspecified: Secondary | ICD-10-CM

## 2023-11-06 MED ORDER — ATENOLOL 25 MG PO TABS: 12.5000 mg | ORAL_TABLET | Freq: Every day | ORAL | 3 refills | Status: DC

## 2023-11-06 NOTE — Patient Instructions (Signed)
 Medication Instructions:  DECREASE Atenolol  to 12.5mg  daily  *If you need a refill on your cardiac medications before your next appointment, please call your pharmacy*  Lab Work: None If you have labs (blood work) drawn today and your tests are completely normal, you will receive your results only by: MyChart Message (if you have MyChart) OR A paper copy in the mail If you have any lab test that is abnormal or we need to change your treatment, we will call you to review the results.  Testing/Procedures: None  Follow-Up: At [facility], you and your health needs are our priority.  As part of our continuing mission to provide you with exceptional heart care, our providers are all part of one team.  This team includes your primary Cardiologist (physician) and Advanced Practice Providers or APPs (Physician Assistants and Nurse Practitioners) who all work together to provide you with the care you need, when you need it.  Your next appointment:   1 year(s)  Provider:   Emeline Calender, MD    Other Instructions Have your primary care doctor send us  a copy of your labs after they have been completed.

## 2023-11-10 ENCOUNTER — Telehealth: Payer: Self-pay | Admitting: Internal Medicine

## 2023-11-10 MED ORDER — ATENOLOL 25 MG PO TABS: 25.0000 mg | ORAL_TABLET | Freq: Every day | ORAL | Status: AC

## 2023-11-10 NOTE — Telephone Encounter (Signed)
 Spoke with pt, she had her covid shot on Wednesday last week and Sunday she had episode of fluttering in her chest. She took another 1/2 of the atenolol . The episode last from 30 minutes to 1 hour. She has increased her atenolol  to 25 mg once daily. Will let tessa conte know.

## 2023-11-10 NOTE — Telephone Encounter (Signed)
 Spoke with pt, aware of recommendations.

## 2023-11-10 NOTE — Telephone Encounter (Signed)
 Pt c/o medication issue:  1. Name of Medication:   atenolol  (TENORMIN ) 25 MG tablet    2. How are you currently taking this medication (dosage and times per day)? 1 tablet   3. Are you having a reaction (difficulty breathing--STAT)? No   4. What is your medication issue? Pt had some light fluttering in her chest on Sunday 11/2. She took a full pill instead of half.Pt is not sure if it is related to her COVID vaccine on 10/29. She is not having any symptoms today. Please advise.

## 2023-11-11 DIAGNOSIS — E782 Mixed hyperlipidemia: Secondary | ICD-10-CM | POA: Diagnosis not present

## 2023-11-11 DIAGNOSIS — E6609 Other obesity due to excess calories: Secondary | ICD-10-CM | POA: Diagnosis not present

## 2023-11-11 DIAGNOSIS — Z6833 Body mass index (BMI) 33.0-33.9, adult: Secondary | ICD-10-CM | POA: Diagnosis not present

## 2023-11-21 ENCOUNTER — Ambulatory Visit: Admitting: Physician Assistant

## 2023-12-08 ENCOUNTER — Ambulatory Visit
Admission: RE | Admit: 2023-12-08 | Discharge: 2023-12-08 | Disposition: A | Source: Ambulatory Visit | Attending: Nurse Practitioner | Admitting: Nurse Practitioner

## 2023-12-08 DIAGNOSIS — R898 Other abnormal findings in specimens from other organs, systems and tissues: Secondary | ICD-10-CM

## 2023-12-08 DIAGNOSIS — Z1239 Encounter for other screening for malignant neoplasm of breast: Secondary | ICD-10-CM | POA: Diagnosis not present

## 2023-12-08 DIAGNOSIS — Z9189 Other specified personal risk factors, not elsewhere classified: Secondary | ICD-10-CM

## 2023-12-08 MED ORDER — GADOPICLENOL 0.5 MMOL/ML IV SOLN
8.0000 mL | Freq: Once | INTRAVENOUS | Status: AC | PRN
  Administered 2023-12-08: 8 mL via INTRAVENOUS

## 2024-06-07 ENCOUNTER — Other Ambulatory Visit (HOSPITAL_BASED_OUTPATIENT_CLINIC_OR_DEPARTMENT_OTHER)
